# Patient Record
Sex: Male | Born: 1985 | Race: Black or African American | Hispanic: No | Smoking: Former smoker
Health system: Southern US, Community
[De-identification: ages and names within clinical notes are randomized; demographics above are authoritative.]

## PROBLEM LIST (undated history)

## (undated) DIAGNOSIS — I10 Essential (primary) hypertension: Secondary | ICD-10-CM

## (undated) DIAGNOSIS — G4730 Sleep apnea, unspecified: Secondary | ICD-10-CM

## (undated) DIAGNOSIS — E119 Type 2 diabetes mellitus without complications: Secondary | ICD-10-CM

## (undated) HISTORY — DX: Sleep apnea, unspecified: G47.30

## (undated) HISTORY — DX: Type 2 diabetes mellitus without complications: E11.9

## (undated) HISTORY — DX: Essential (primary) hypertension: I10

---
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
## 2010-08-19 ENCOUNTER — Inpatient Hospital Stay (INDEPENDENT_AMBULATORY_CARE_PROVIDER_SITE_OTHER)
Admission: RE | Admit: 2010-08-19 | Discharge: 2010-08-19 | Disposition: A | Discharge status: Home / Self Care | Payer: Managed Care, Other (non HMO) | Source: Ambulatory Visit | Attending: Emergency Medicine | Admitting: Emergency Medicine

## 2010-08-19 DIAGNOSIS — L989 Disorder of the skin and subcutaneous tissue, unspecified: Secondary | ICD-10-CM

## 2010-09-09 ENCOUNTER — Inpatient Hospital Stay (INDEPENDENT_AMBULATORY_CARE_PROVIDER_SITE_OTHER)
Admission: RE | Admit: 2010-09-09 | Discharge: 2010-09-09 | Disposition: A | Discharge status: Home / Self Care | Payer: Managed Care, Other (non HMO) | Source: Ambulatory Visit | Attending: Family Medicine | Admitting: Family Medicine

## 2010-09-09 DIAGNOSIS — L980 Pyogenic granuloma: Secondary | ICD-10-CM

## 2010-09-09 DIAGNOSIS — Z4802 Encounter for removal of sutures: Secondary | ICD-10-CM

## 2010-09-09 DIAGNOSIS — Z0489 Encounter for examination and observation for other specified reasons: Secondary | ICD-10-CM

## 2010-09-09 LAB — RPR: RPR Ser Ql: NONREACTIVE

## 2010-09-10 LAB — GC/CHLAMYDIA PROBE AMP, GENITAL
Chlamydia, DNA Probe: NEGATIVE
GC Probe Amp, Genital: NEGATIVE

## 2011-09-04 ENCOUNTER — Ambulatory Visit (INDEPENDENT_AMBULATORY_CARE_PROVIDER_SITE_OTHER): Payer: Managed Care, Other (non HMO) | Admitting: Physician Assistant

## 2011-09-04 VITALS — BP 132/90 | HR 88 | Temp 98.5°F | Resp 16 | Ht 71.0 in | Wt 265.6 lb

## 2011-09-04 DIAGNOSIS — L03319 Cellulitis of trunk, unspecified: Secondary | ICD-10-CM

## 2011-09-04 DIAGNOSIS — L02219 Cutaneous abscess of trunk, unspecified: Secondary | ICD-10-CM

## 2011-09-04 DIAGNOSIS — L03311 Cellulitis of abdominal wall: Secondary | ICD-10-CM

## 2011-09-04 LAB — POCT CBC
Hemoglobin: 15.6 g/dL (ref 14.1–18.1)
Lymph, poc: 3.2 (ref 0.6–3.4)
MCHC: 31.3 g/dL — AB (ref 31.8–35.4)
Platelet Count, POC: 322 10*3/uL (ref 142–424)
WBC: 10.4 10*3/uL — AB (ref 4.6–10.2)

## 2011-09-04 MED ORDER — DOXYCYCLINE HYCLATE 100 MG PO CAPS: 100.0000 mg | ORAL_CAPSULE | Freq: Two times a day (BID) | ORAL | Status: AC

## 2011-09-04 NOTE — Progress Notes (Signed)
Patient ID: Joseph Bullock MRN: [id number], DOB: 08/07/1985, 26 y.o. Date of Encounter: 09/04/2011, 8:41 AM  Primary Physician: No primary provider on file.  Chief Complaint: Bump on abdomen for 2 days  HPI: 26 y.o. year old male with history below presents with bump along his left lower abdomen for two days. Had a similar lesion 1-2 months prior that self resolved. Afebrile. No chills. No drainage. Mild erythema over the bump only. Tender only if he bumps into something. No nausea or vomiting.   Generally healthy.  No past medical history on file.   Home Meds: Prior to Admission medications   Not on File    Allergies: No Known Allergies  History   Social History  . Marital Status: Single    Spouse Name: N/A    Number of Children: N/A  . Years of Education: N/A   Occupational History  . Not on file.   Social History Main Topics  . Smoking status: Current Some Day Smoker  . Smokeless tobacco: Not on file  . Alcohol Use: Not on file  . Drug Use: Not on file  . Sexually Active: Not on file   Other Topics Concern  . Not on file   Social History Narrative  . No narrative on file     Review of Systems: Constitutional: negative for chills, fever, night sweats, weight changes, or fatigue  HEENT: negative for vision changes, hearing loss, congestion, rhinorrhea, ST, epistaxis, or sinus pressure Cardiovascular: negative for chest pain or palpitations Respiratory: negative for hemoptysis, wheezing, shortness of breath, or cough Abdominal: negative for abdominal pain, nausea, vomiting, diarrhea, or constipation Dermatological: see above Neurologic: negative for headache, dizziness, or syncope All other systems reviewed and are otherwise negative with the exception to those above and in the HPI.   Physical Exam: Blood pressure 132/90, pulse 88, temperature 98.5 F (36.9 C), temperature source Oral, resp. rate 16, height 5\' 11"  (1.803 m), weight 265 lb 9.6 oz (120.475  kg), SpO2 96.00%., Body mass index is 37.04 kg/(m^2). General: Well developed, well nourished, in no acute distress. Head: Normocephalic, atraumatic, eyes without discharge, sclera non-icteric, nares are without discharge.   Neck: Supple. No thyromegaly. Full ROM. No lymphadenopathy. Lungs: Clear bilaterally to auscultation without wheezes, rales, or rhonchi. Breathing is unlabored. Heart: RRR with S1 S2. No murmurs, rubs, or gallops appreciated. Abdomen: Soft, non-tender, non-distended with normoactive bowel sounds. No hepatosplenomegaly. No rebound/guarding. Pinpoint erythematous pustule along left lower abdomen without local erythema. Msk:  Strength and tone normal for age. Extremities/Skin: Warm and dry. No clubbing or cyanosis. No edema. No rashes or suspicious lesions. Neuro: Alert and oriented X 3. Moves all extremities spontaneously. Gait is normal. CNII-XII grossly in tact. Psych:  Responds to questions appropriately with a normal affect.    Procedure: VCO. Risks and benefits explained. Betadine prep per usual protocol. 20 gauge needle used to unroof lesion. Scant purulence expressed. Dressed. Patient tolerated well.  Labs: Results for orders placed in visit on 09/04/11  POCT CBC      Component Value Range   WBC 10.4 (*) 4.6 - 10.2 K/uL   Lymph, poc 3.2  0.6 - 3.4   POC LYMPH PERCENT 30.9  10 - 50 %L   MID (cbc) 0.7  0 - 0.9   POC MID % 6.7  0 - 12 %M   POC Granulocyte 6.5  2 - 6.9   Granulocyte percent 62.4  37 - 80 %G   RBC 5.50  4.69 -  6.13 M/uL   Hemoglobin 15.6  14.1 - 18.1 g/dL   HCT, POC 40.9  81.1 - 53.7 %   MCV 90.5  80 - 97 fL   MCH, POC 28.4  27 - 31.2 pg   MCHC 31.3 (*) 31.8 - 35.4 g/dL   RDW, POC 91.4     Platelet Count, POC 322  142 - 424 K/uL   MPV 8.4  0 - 99.8 fL   Wound culture pending.  ASSESSMENT AND PLAN:  26 y.o. year old male with mild cellulitis/folliciulitis -Doxycycline 100 mg 1 po bid #20 no RF -Warm compresses -Await culture  results -If worsens RTC -No I&D needed at this time  Elinor Dodge, PA-C 09/04/2011 8:41 AM

## 2011-09-07 LAB — WOUND CULTURE

## 2011-11-24 ENCOUNTER — Ambulatory Visit (INDEPENDENT_AMBULATORY_CARE_PROVIDER_SITE_OTHER): Payer: Managed Care, Other (non HMO) | Admitting: Internal Medicine

## 2011-11-24 VITALS — BP 127/81 | HR 76 | Temp 98.5°F | Resp 16 | Ht 72.0 in | Wt 263.0 lb

## 2011-11-24 DIAGNOSIS — Z6835 Body mass index (BMI) 35.0-35.9, adult: Secondary | ICD-10-CM | POA: Insufficient documentation

## 2011-11-24 DIAGNOSIS — F17200 Nicotine dependence, unspecified, uncomplicated: Secondary | ICD-10-CM | POA: Insufficient documentation

## 2011-11-24 DIAGNOSIS — A088 Other specified intestinal infections: Secondary | ICD-10-CM

## 2011-11-24 MED ORDER — DICYCLOMINE HCL 10 MG PO CAPS: 10.0000 mg | ORAL_CAPSULE | Freq: Three times a day (TID) | ORAL | Status: DC

## 2011-11-24 NOTE — Addendum Note (Signed)
Addended by: Tonye Pearson on: 11/24/2011 04:07 PM   Modules accepted: Level of Service

## 2011-11-24 NOTE — Progress Notes (Signed)
  Subjective:    Patient ID: Joseph Bullock, male    DOB: 1985/03/13, 26 y.o.   MRN: [id number]  HPI 25 yo AA M c/o painful diarrhea starting 9/28 am.  It occurs mostly after eating and preceded by sharp pains in the central epigastric region.  It happens about 3x/day and is a watery consistency.  He denies having blood in his stool but has noticed trace amounts on his tissue paper once.  No hx of an episode like this in the past and he denies having any sick contacts.  He shares that he did a lot of grilling out this weekend and cooked several different kinds of meat.  He has not had any fever, chills, or N/V.  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      Review of Systems  noncontrib     Objective:   Physical Exam General: Alert, cooperative 26 yo male in no apparent distress. Vs stable HEENT: Nontraumatic, EOMIT, trachea midline Heart: RRR, no murmurs Lungs: CTA bilaterally, no wheezing, rales, rhonchi Abdomen: Large contour, distant bs x4, nontender to palpation/no organomegaly or masses Neuro: Alert and oriented MSK: normal bulk and tone.         Assessment & Plan:  1) Gastroenteritis - dicyclomine 10 mg qid for next 24 hr//full liquids, small portions and advance as tolerated 2) Note for work today 3) RTC as needed or at then end of the week if s/s not resolved, or if develops fever  Needs routine followup to address health maintenance issues Patient Active Problem List  Diagnosis  . BMI 35.0-35.9,adult  . Nicotine addiction

## 2011-12-08 ENCOUNTER — Ambulatory Visit (INDEPENDENT_AMBULATORY_CARE_PROVIDER_SITE_OTHER): Payer: Managed Care, Other (non HMO) | Admitting: Emergency Medicine

## 2011-12-08 VITALS — BP 142/86 | HR 83 | Temp 98.3°F | Resp 20 | Ht 70.75 in | Wt 262.2 lb

## 2011-12-08 DIAGNOSIS — R319 Hematuria, unspecified: Secondary | ICD-10-CM

## 2011-12-08 DIAGNOSIS — N3090 Cystitis, unspecified without hematuria: Secondary | ICD-10-CM

## 2011-12-08 DIAGNOSIS — Z2089 Contact with and (suspected) exposure to other communicable diseases: Secondary | ICD-10-CM

## 2011-12-08 DIAGNOSIS — N3000 Acute cystitis without hematuria: Secondary | ICD-10-CM

## 2011-12-08 DIAGNOSIS — Z202 Contact with and (suspected) exposure to infections with a predominantly sexual mode of transmission: Secondary | ICD-10-CM

## 2011-12-08 LAB — POCT URINALYSIS DIPSTICK
Bilirubin, UA: NEGATIVE
Ketones, UA: NEGATIVE
Leukocytes, UA: NEGATIVE
Nitrite, UA: NEGATIVE
Protein, UA: 30

## 2011-12-08 LAB — HIV ANTIBODY (ROUTINE TESTING W REFLEX): HIV: NONREACTIVE

## 2011-12-08 LAB — POCT UA - MICROSCOPIC ONLY
Crystals, Ur, HPF, POC: NEGATIVE
Mucus, UA: POSITIVE

## 2011-12-08 MED ORDER — SULFAMETHOXAZOLE-TRIMETHOPRIM 800-160 MG PO TABS: 1.0000 | ORAL_TABLET | Freq: Two times a day (BID) | ORAL | Status: DC

## 2011-12-08 NOTE — Progress Notes (Signed)
[facility] [address], [place] [address] [phone]  Date:  12/08/2011   Name:  Joseph Bullock   DOB:  05/26/1985   MRN:  [id number]  PCP:  No primary provider on file.    Chief Complaint: Exposure to STD and Hematuria   History of Present Illness:  Joseph Bullock is a 26 y.o. very pleasant male patient who presents with the following:  Comes in requesting a check for STD's.  Denies any symptoms, no history or discharge or dysuria or rash.    Patient Active Problem List  Diagnosis  . BMI 35.0-35.9,adult  . Nicotine addiction    No past medical history on file.  No past surgical history on file.  History  Substance Use Topics  . Smoking status: Current Some Day Smoker  . Smokeless tobacco: Not on file  . Alcohol Use: Not on file    No family history on file.  No Known Allergies  Medication list has been reviewed and updated.  Current Outpatient Prescriptions on File Prior to Visit  Medication Sig Dispense Refill  . dicyclomine (BENTYL) 10 MG capsule Take 1 capsule (10 mg total) by mouth 4 (four) times daily -  before meals and at bedtime.  10 capsule  0    Review of Systems:  As per HPI, otherwise negative.    Physical Examination: Filed Vitals:   12/08/11 0756  BP: 142/86  Pulse: 83  Temp: 98.3 F (36.8 C)  Resp: 20   Filed Vitals:   12/08/11 0756  Height: 5' 10.75" (1.797 m)  Weight: 262 lb 3.2 oz (118.933 kg)   Body mass index is 36.83 kg/(m^2). Ideal Body Weight: Weight in (lb) to have BMI = 25: 177.6    GEN: WDWN, NAD, Non-toxic, Alert & Oriented x 3 HEENT: Atraumatic, Normocephalic.  Ears and Nose: No external deformity. EXTR: No clubbing/cyanosis/edema NEURO: Normal gait.  PSYCH: Normally interactive. Conversant. Not depressed or anxious appearing.  Calm demeanor.    Assessment and Plan: STD exposure Labs Hematuria sent by DOT examiner Repeat UA when completes septra Treat based upon labs Was here  last week and his diarrhea is resolved and he is "well"  Carmelina Dane, MD  Results for orders placed in visit on 12/08/11  POCT URINALYSIS DIPSTICK      Component Value Range   Color, UA amber     Clarity, UA clear     Glucose, UA neg     Bilirubin, UA neg     Ketones, UA neg     Spec Grav, UA >=1.030     Blood, UA large     pH, UA 5.5     Protein, UA 30     Urobilinogen, UA 0.2     Nitrite, UA neg     Leukocytes, UA Negative    POCT UA - MICROSCOPIC ONLY      Component Value Range   WBC, Ur, HPF, POC 0-2     RBC, urine, microscopic 12-14     Bacteria, U Microscopic trace     Mucus, UA positive     Epithelial cells, urine per micros 0-1     Crystals, Ur, HPF, POC neg     Casts, Ur, LPF, POC neg     Yeast, UA positive     I have reviewed and agree with documentation. Robert P. Merla Riches, M.D.

## 2011-12-09 LAB — GC/CHLAMYDIA PROBE AMP, URINE: Chlamydia, Swab/Urine, PCR: NEGATIVE

## 2011-12-09 LAB — HSV(HERPES SIMPLEX VRS) I + II AB-IGG: HSV 1 Glycoprotein G Ab, IgG: <0.1 IV

## 2011-12-11 ENCOUNTER — Ambulatory Visit (INDEPENDENT_AMBULATORY_CARE_PROVIDER_SITE_OTHER): Payer: Managed Care, Other (non HMO) | Admitting: Emergency Medicine

## 2011-12-11 VITALS — BP 146/80 | HR 87 | Temp 98.1°F | Resp 18 | Ht 71.0 in | Wt 266.4 lb

## 2011-12-11 DIAGNOSIS — R319 Hematuria, unspecified: Secondary | ICD-10-CM

## 2011-12-11 LAB — POCT UA - MICROSCOPIC ONLY
Casts, Ur, LPF, POC: NEGATIVE
Crystals, Ur, HPF, POC: NEGATIVE
Mucus, UA: NEGATIVE
Yeast, UA: NEGATIVE

## 2011-12-11 LAB — POCT URINALYSIS DIPSTICK
Nitrite, UA: NEGATIVE
Protein, UA: NEGATIVE
Spec Grav, UA: 1.015
Urobilinogen, UA: 0.2
pH, UA: 6.5

## 2011-12-11 NOTE — Progress Notes (Signed)
[facility] [address] [place] [address] [phone]  Date:  12/11/2011   Name:  Joseph Bullock   DOB:  13-Mar-1985   MRN:  [id number]  PCP:  No primary provider on file.    Chief Complaint: Hematuria   History of Present Illness:  Joseph Bullock is a 26 y.o. 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  Patient describes life long history of hematuria that was evaluated while a child.  Denies complaints.  Patient Active Problem List  Diagnosis  . BMI 35.0-35.9,adult  . Nicotine addiction    No past medical history on file.  No past surgical history on file.  History  Substance Use Topics  . Smoking status: Current Some Day Smoker  . Smokeless tobacco: Not on file  . Alcohol Use: Not on file    No family history on file.  No Known Allergies  Medication list has been reviewed and updated.  Current Outpatient Prescriptions on File Prior to Visit  Medication Sig Dispense Refill  . sulfamethoxazole-trimethoprim (BACTRIM DS,SEPTRA DS) 800-160 MG per tablet Take 1 tablet by mouth 2 (two) times daily.  20 tablet  0  . dicyclomine (BENTYL) 10 MG capsule Take 1 capsule (10 mg total) by mouth 4 (four) times daily -  before meals and at bedtime.  10 capsule  0    Review of Systems:  As per HPI, otherwise negative.    Physical Examination: Filed Vitals:   12/11/11 0804  BP: 146/80  Pulse: 87  Temp: 98.1 F (36.7 C)  Resp: 18   Filed Vitals:   12/11/11 0804  Height: 5\' 11"  (1.803 m)  Weight: 266 lb 6.4 oz (120.838 kg)   Body mass index is 37.16 kg/(m^2). Ideal Body Weight: Weight in (lb) to have BMI = 25: 178.9    GEN: WDWN, NAD, Non-toxic, Alert & Oriented x 3 HEENT: Atraumatic, Normocephalic.  Ears and Nose: No external deformity. EXTR: No clubbing/cyanosis/edema NEURO: Normal gait.  PSYCH: Normally interactive. Conversant. Not  depressed or anxious appearing.  Calm demeanor.  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   Carmelina Dane, MD  Results for orders placed in visit on 12/11/11  POCT UA - MICROSCOPIC ONLY      Component Value Range   WBC, Ur, HPF, POC 0-1     RBC, urine, microscopic 10-16     Bacteria, U Microscopic trace     Mucus, UA neg     Epithelial cells, urine per micros 0-1     Crystals, Ur, HPF, POC neg     Casts, Ur, LPF, POC neg     Yeast, UA neg    POCT URINALYSIS DIPSTICK      Component Value Range   Color, UA yellow     Clarity, UA clear     Glucose, UA neg     Bilirubin, UA neg     Ketones, UA neg     Spec Grav, UA 1.015     Blood, UA large     pH, UA 6.5     Protein, UA neg     Urobilinogen, UA 0.2     Nitrite, UA neg     Leukocytes, UA Negative     Ref uro  I have reviewed and agree with  documentation. Robert P. Merla Riches, M.D.

## 2012-01-07 ENCOUNTER — Ambulatory Visit (INDEPENDENT_AMBULATORY_CARE_PROVIDER_SITE_OTHER): Payer: Managed Care, Other (non HMO) | Admitting: Emergency Medicine

## 2012-01-07 VITALS — BP 141/88 | HR 88 | Temp 98.3°F | Resp 18 | Ht 71.0 in | Wt 264.0 lb

## 2012-01-07 DIAGNOSIS — G4733 Obstructive sleep apnea (adult) (pediatric): Secondary | ICD-10-CM | POA: Insufficient documentation

## 2012-01-07 DIAGNOSIS — E669 Obesity, unspecified: Secondary | ICD-10-CM

## 2012-01-07 NOTE — Progress Notes (Signed)
  Subjective:    Patient ID: Joseph Bullock, male    DOB: November 29, 1985, 26 y.o.   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 Time he was diagnosed with sleep apnea and was prescribed a machine that he did not get it. Because his work is requiring that he have a CPAP machine be titrated and be compliant with use of the machine to    Review of Systems     Objective:   Physical Exam chest is clear . Heart regular rate no murmurs rubs or gallops. Abdomen soft nontender. Extremities without edema the        Assessment & Plan:  Patient needs referral for sleep study with Dr. Cherlynn June and her sleep lab. Patient was advised at under CDL guidelines he must be compliant with his CPAP machine. Patient braced understanding of this

## 2012-02-09 ENCOUNTER — Encounter: Payer: Self-pay | Admitting: Neurology

## 2012-03-09 ENCOUNTER — Ambulatory Visit (INDEPENDENT_AMBULATORY_CARE_PROVIDER_SITE_OTHER): Payer: Managed Care, Other (non HMO) | Admitting: Family Medicine

## 2012-03-09 VITALS — BP 140/88 | HR 95 | Temp 98.1°F | Resp 16 | Ht 71.0 in | Wt 259.0 lb

## 2012-03-09 DIAGNOSIS — Z23 Encounter for immunization: Secondary | ICD-10-CM

## 2012-03-09 DIAGNOSIS — R3129 Other microscopic hematuria: Secondary | ICD-10-CM

## 2012-03-09 DIAGNOSIS — Z0000 Encounter for general adult medical examination without abnormal findings: Secondary | ICD-10-CM

## 2012-03-09 DIAGNOSIS — I4949 Other premature depolarization: Secondary | ICD-10-CM

## 2012-03-09 DIAGNOSIS — I493 Ventricular premature depolarization: Secondary | ICD-10-CM

## 2012-03-09 NOTE — Patient Instructions (Addendum)
Good to see you today!  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  If you start to notice any symptoms such as heart palpitations or chest pain please seek care.  However, the early beats you showed on your EKG today are very common and rarely of any concern.    You do show some blood in your urine today- this could be significant if it persists.  Please have a repeat urinalysis in the next 3 or 4 weeks.    I will order this- you can come in for just a lab visit at your convenience.

## 2012-03-09 NOTE — Progress Notes (Signed)
[facility] [address], [place] [address] [phone]  Date:  03/09/2012   Name:  Joseph Bullock   DOB:  March 05, 1985   MRN:  [id number]  PCP:  Joseph Hoard, MD    Chief Complaint: Annual Exam   History of Present Illness:  Joseph Bullock is a 27 y.o. very pleasant male patient who presents with the following:  Here for a DOT exam and annual CPE today. He was seen in November to follow- up his CPAP use- he was referred back to Dr. Vickey Huger for evaluation.  He did follow- up as directed. Had a repeat sleep study and CPAP titration.    He is using his CPAP machine- he is doing well with this and finds it is comfortable to use, feels much better with CPAP use.  He wears the machine about 5 hours a night.  He has lost 5 lbs since November.    Joseph Bullock declined any lab work today, but would like to get a flu shot and a tetanus shot- he has not had a tetanus shot since he was a child.   He is otherwise healthy, but he does note a history of microhematuria.  Per his report this has been present for many years and he has had a thorough work- up, found to be benign.    Patient Active Problem List  Diagnosis  . BMI 35.0-35.9,adult  . Nicotine addiction  . OSA (obstructive sleep apnea)    No past medical history on file.  No past surgical history on file.  History  Substance Use Topics  . Smoking status: Current Some Day Smoker  . Smokeless tobacco: Not on file  . Alcohol Use: Not on file    No family history on file.  No Known Allergies  Medication list has been reviewed and updated.  Current Outpatient Prescriptions on File Prior to Visit  Medication Sig Dispense Refill  . dicyclomine (BENTYL) 10 MG capsule Take 1 capsule (10 mg total) by mouth 4 (four) times daily -  before meals and at bedtime.  10 capsule  0  . sulfamethoxazole-trimethoprim (BACTRIM DS,SEPTRA DS) 800-160 MG per tablet Take 1 tablet by mouth 2 (two) times daily.  20 tablet  0     Review of Systems:  As per HPI- otherwise negative.   Physical Examination: Filed Vitals:   03/09/12 0812  BP: 140/88  Pulse: 95  Temp: 98.1 F (36.7 C)  Resp: 16   Filed Vitals:   03/09/12 0812  Height: 5\' 11"  (1.803 m)  Weight: 259 lb (117.482 kg)   Body mass index is 36.12 kg/(m^2). Ideal Body Weight: Weight in (lb) to have BMI = 25: 178.9   GEN: WDWN, NAD, Non-toxic, A & O x 3, obese HEENT: Atraumatic, Normocephalic. Neck supple. No masses, No LAD. Bilateral TM wnl, oropharynx normal.  PEERL,EOMI.   Ears and Nose: No external deformity. CV: RRR with occasional skipped beat- likely PVC, No M/G/R. No JVD. No thrill. No extra heart sounds. PULM: CTA B, no wheezes, crackles, rhonchi. No retractions. No resp. distress. No accessory muscle use. ABD: S, NT, ND, +BS. No rebound. No HSM. EXTR: No c/c/e NEURO Normal gait.  PSYCH: Normally interactive. Conversant. Not depressed or anxious appearing.  Calm demeanor.  No inguinal hernia.   Results for orders placed in visit on 12/11/11  POCT UA - MICROSCOPIC ONLY      Component Value Range   WBC, Ur, HPF, POC 0-1  RBC, urine, microscopic 10-16     Bacteria, U Microscopic trace     Mucus, UA neg     Epithelial cells, urine per micros 0-1     Crystals, Ur, HPF, POC neg     Casts, Ur, LPF, POC neg     Yeast, UA neg    POCT URINALYSIS DIPSTICK      Component Value Range   Color, UA yellow     Clarity, UA clear     Glucose, UA neg     Bilirubin, UA neg     Ketones, UA neg     Spec Grav, UA 1.015     Blood, UA large     pH, UA 6.5     Protein, UA neg     Urobilinogen, UA 0.2     Nitrite, UA neg     Leukocytes, UA Negative      EKG: occasional PVC, no concerning ST elevation or depression  Assessment and Plan: 1. Physical exam, annual    2. PVC (premature ventricular contraction)  EKG 12-Lead  3. Immunization due  Flu vaccine greater than or equal to 3yo preservative free IM, Tdap vaccine greater than or  equal to 7yo IM  4. Microhematuria  POCT UA - Microscopic Only, POCT urinalysis dipstick   Discussed microhematuria with pt- this has been evaluated.  He will disregard pt instructions to recheck a UA.  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 Occasional PVCs on EKG and ascultation.  He is asymptomatic and does not have any multiple PVCs in sequence.  This finding is almost certainly benign, but did ask him to report if any symptoms such as CP or palpitations.    Abbe Amsterdam, MD

## 2012-05-19 ENCOUNTER — Encounter: Payer: Self-pay | Admitting: Neurology

## 2012-05-27 ENCOUNTER — Encounter: Payer: Self-pay | Admitting: Neurology

## 2012-06-03 ENCOUNTER — Telehealth: Payer: Self-pay | Admitting: Neurology

## 2012-06-03 NOTE — Telephone Encounter (Signed)
Called the patient and advised him that his cpap compliance was low and requested a call back to the office for discussion.

## 2012-06-03 NOTE — Telephone Encounter (Signed)
Message copied by [facility] [employer] on Fri Jun 03, 2012 10:51 AM ------      Message from: [facility], CARMEN      Created: Fri May 27, 2012  6:05 PM        [facility] patient with low compliance , all data were here. CD ------

## 2012-07-02 ENCOUNTER — Encounter: Payer: Self-pay | Admitting: Neurology

## 2012-07-04 NOTE — Progress Notes (Signed)
Quick Note:  Dear Mr . Wilhoite ,  Your current CPAP machine only records the hours of use , but fails to provide diagnostic data .  I recommend to have your DME provide a loan machine for several days , that can provide these data to see how well you are doing in terms of apnea resolution.  Please give [place] a call at [phone].   Sincerely ,   Ambera Fedele, MD   ______

## 2012-07-15 ENCOUNTER — Ambulatory Visit (INDEPENDENT_AMBULATORY_CARE_PROVIDER_SITE_OTHER): Payer: Managed Care, Other (non HMO) | Admitting: Emergency Medicine

## 2012-07-15 VITALS — BP 126/86 | HR 67 | Temp 98.5°F | Resp 16 | Ht 71.0 in | Wt 251.2 lb

## 2012-07-15 DIAGNOSIS — Z2089 Contact with and (suspected) exposure to other communicable diseases: Secondary | ICD-10-CM

## 2012-07-15 DIAGNOSIS — Z202 Contact with and (suspected) exposure to infections with a predominantly sexual mode of transmission: Secondary | ICD-10-CM

## 2012-07-15 DIAGNOSIS — R5381 Other malaise: Secondary | ICD-10-CM

## 2012-07-15 LAB — HEPATITIS C ANTIBODY: HCV Ab: NEGATIVE

## 2012-07-15 LAB — RPR

## 2012-07-15 NOTE — Progress Notes (Signed)
  Subjective:    Patient ID: Joseph Bullock, male    DOB: 11-29-1985, 27 y.o.   MRN: [id number]  HPI patient enters with the chief complaint of fatigue and a history of 3 sexual encounters this year . He has a burning at the tip of his penis but no true discharge. He is concerned he may have a low testosterone would like this checked    Review of Systems     Objective:   Physical Exam HEENT exam is unremarkable neck is supple. Chest is clear to auscultation and percussion. Heart regular no murmurs. Abdomen soft nontender. Genital exam reveals no discharge testes are normal        Assessment & Plan:  Testosterone Gen-Probe RPR HIV and hep C done to

## 2012-07-16 LAB — GC/CHLAMYDIA PROBE AMP: GC Probe RNA: NEGATIVE

## 2012-07-19 LAB — TESTOSTERONE, FREE, TOTAL, SHBG
Sex Hormone Binding: 15 nmol/L (ref 13–71)
Testosterone, Free: 83.8 pg/mL (ref 47.0–244.0)

## 2012-08-16 ENCOUNTER — Encounter: Payer: Self-pay | Admitting: Family Medicine

## 2013-02-20 ENCOUNTER — Encounter: Payer: Self-pay | Admitting: Neurology

## 2013-03-22 ENCOUNTER — Encounter: Payer: Self-pay | Admitting: Neurology

## 2013-03-23 ENCOUNTER — Encounter: Payer: Self-pay | Admitting: Neurology

## 2013-04-24 ENCOUNTER — Ambulatory Visit (INDEPENDENT_AMBULATORY_CARE_PROVIDER_SITE_OTHER): Payer: Managed Care, Other (non HMO) | Admitting: Physician Assistant

## 2013-04-24 VITALS — BP 120/72 | HR 79 | Temp 98.0°F | Resp 18 | Ht 71.0 in | Wt 250.0 lb

## 2013-04-24 DIAGNOSIS — Z113 Encounter for screening for infections with a predominantly sexual mode of transmission: Secondary | ICD-10-CM

## 2013-04-24 NOTE — Progress Notes (Signed)
   Subjective:    Patient ID: Joseph Bullock, male    DOB: 1985/06/04, 28 y.o.   MRN: [id number]  HPI   Mr. Joseph Bullock a very pleasant 28 yr old male here requesting STI testing.  He is in a new relationship and would like testing.  He reports he is currently using condoms.  He has 1 sexual partner.  He denies any symptoms.  Partner is also asymptomatic.  He was last tested in May 2014 - all negative at that time.  He has never tested positive.  He otherwise feels well today.    Review of Systems  Constitutional: Negative for fever and chills.  Respiratory: Negative.   Cardiovascular: Negative.   Gastrointestinal: Negative.   Genitourinary: Negative for dysuria, discharge and genital sores.  Musculoskeletal: Negative.   Skin: Negative.        Objective:   Physical Exam  Vitals reviewed. Constitutional: He is oriented to person, place, and time. He appears well-developed and well-nourished. No distress.  HENT:  Head: Normocephalic and atraumatic.  Eyes: Conjunctivae are normal. No scleral icterus.  Cardiovascular: Normal rate, regular rhythm and normal heart sounds.   Pulmonary/Chest: Effort normal and breath sounds normal. He has no wheezes. He has no rales.  Abdominal: Soft. Bowel sounds are normal. There is no tenderness.  Neurological: He is alert and oriented to person, place, and time.  Skin: Skin is warm and dry.  Psychiatric: He has a normal mood and affect. His behavior is normal.       Assessment & Plan:  Screen for STD (sexually transmitted disease) - Plan: GC/Chlamydia Probe Amp, HIV antibody, RPR, HSV(herpes simplex vrs) 1+2 ab-IgG   Mr. Joseph Bullock is a pleasant 28 yr old male here for STI testing.  He is asymptomatic today but would like testing as he is in a new relationship.  Uriprobe, HIV, RPR, and HSV testing all pending.  We discussed that HSV ab testing would not give us information on when/where he was exposed, or if/when he will have an outbreak.  He understands and  would like to proceed with testing.  Will follow up on results and treat if necessary  Pt to call or RTC if worsening or not improving  E. Frances FurbishElizabeth Mirabel Bullock MHS, PA-C [facility] 3/2/20153:05 PM

## 2013-04-24 NOTE — Patient Instructions (Signed)
Safe Sex Safe sex is about reducing the risk of giving or getting a sexually transmitted disease (STD). STDs are spread through sexual contact involving the genitals, mouth, or rectum. Some STDS can be cured and others cannot. Safe sex can also prevent unintended pregnancies.  SAFE SEX PRACTICES  Limit your sexual activity to only one partner who is only having sex with you.  Talk to your partner about their past partners, past STDs, and drug use.  Use a condom every time you have sexual intercourse. This includes vaginal, oral, and anal sexual activity. Both females and males should wear condoms during oral sex. Only use latex or polyurethane condoms and water-based lubricants. Petroleum-based lubricants or oils used to lubricate a condom will weaken the condom and increase the chance that it will break. The condom should be in place from the beginning to the end of sexual activity. Wearing a condom reduces, but does not completely eliminate, your risk of getting or giving a STD. STDs can be spread by contact with skin of surrounding areas.  Get vaccinated for hepatitis B and HPV.  Avoid alcohol and recreational drugs which can affect your judgement. You may forget to use a condom or participate in high-risk sex.  For females, avoid douching after sexual intercourse. Douching can spread an infection farther into the reproductive tract.  Check your body for signs of sores, blisters, rashes, or unusual discharge. See your caregiver if you notice any of these signs.  Avoid sexual contact if you have symptoms of an infection or are being treated for an STD. If you or your partner has herpes, avoid sexual contact when blisters are present. Use condoms at all other times.  See your caregiver for regular screenings, examinations, and tests for STDs. Before having sex with a new partner, each of you should be screened for STDs and talk about the results with your partner. BENEFITS OF SAFE SEX   There  is less of a chance of getting or giving an STD.  You can prevent unwanted or unintended pregnancies.  By discussing safer sex concerns with your partner, you may increase feelings of intimacy, comfort, trust, and honesty between the both of you. Document Released: 03/19/2004 Document Revised: 11/04/2011 Document Reviewed: 08/03/2011 [employer] Patient Information 2014 [employer].  

## 2013-04-25 LAB — HSV(HERPES SIMPLEX VRS) I + II AB-IGG: HSV 2 Glycoprotein G Ab, IgG: 0.11 IV

## 2013-04-25 LAB — GC/CHLAMYDIA PROBE AMP
CT Probe RNA: NEGATIVE
GC Probe RNA: NEGATIVE

## 2013-04-25 LAB — HIV ANTIBODY (ROUTINE TESTING W REFLEX): HIV: NONREACTIVE

## 2013-04-25 LAB — RPR

## 2013-05-16 ENCOUNTER — Encounter: Payer: Self-pay | Admitting: Neurology

## 2013-05-18 ENCOUNTER — Encounter: Payer: Self-pay | Admitting: Neurology

## 2013-06-06 ENCOUNTER — Encounter: Payer: Self-pay | Admitting: *Deleted

## 2013-06-07 ENCOUNTER — Ambulatory Visit: Payer: Managed Care, Other (non HMO) | Admitting: Neurology

## 2014-01-14 ENCOUNTER — Ambulatory Visit (INDEPENDENT_AMBULATORY_CARE_PROVIDER_SITE_OTHER): Payer: Managed Care, Other (non HMO) | Admitting: Family Medicine

## 2014-01-14 VITALS — BP 120/80 | HR 68 | Temp 97.9°F | Resp 20 | Ht 71.25 in | Wt 248.2 lb

## 2014-01-14 DIAGNOSIS — N342 Other urethritis: Secondary | ICD-10-CM

## 2014-01-14 LAB — RPR

## 2014-01-14 LAB — HIV ANTIBODY (ROUTINE TESTING W REFLEX): HIV 1&2 Ab, 4th Generation: NONREACTIVE

## 2014-01-14 MED ORDER — AZITHROMYCIN 250 MG PO TABS: ORAL_TABLET | ORAL | Status: DC

## 2014-01-14 MED ORDER — CEFTRIAXONE SODIUM 250 MG IJ SOLR: 250.0000 mg | Freq: Once | INTRAMUSCULAR | Status: DC

## 2014-01-14 MED ORDER — CEFTRIAXONE SODIUM 1 G IJ SOLR
250.0000 mg | Freq: Once | INTRAMUSCULAR | Status: AC
  Administered 2014-01-14: 250 mg via INTRAMUSCULAR

## 2014-01-14 NOTE — Progress Notes (Signed)
° °  Subjective:    Patient ID: Joseph PalauJames T Son, male    DOB: Mar 10, 1985, 28 y.o.   MRN: [id number] This chart was scribed for Elvina SidleKurt Lauenstein, MD by Jolene Provostobert Halas, Medical Scribe. This patient was seen in Room 13 and the patient's care was started a 12:54 PM.  Chief Complaint  Patient presents with   Cough    cough for several weeks.  would also like std check    HPI HPI Comments: Joseph Bullock is a 28 y.o. male who presents to [facility] complaining of penile discharge that started two days ago. Pt states that he slept someone one week ago and two days ago developed dysuria and penile discharge.   Pt is a Naval architecttruck driver.   Review of Systems  Constitutional: Negative for fever and chills.  Genitourinary: Positive for dysuria, discharge and penile pain.  Skin: Negative for rash.       Objective:   Physical Exam  Constitutional: He is oriented to person, place, and time. He appears well-developed and well-nourished.  HENT:  Head: Normocephalic and atraumatic.  Eyes: Pupils are equal, round, and reactive to light.  Neck: Neck supple.  Cardiovascular: Normal rate and regular rhythm.   Pulmonary/Chest: Effort normal and breath sounds normal. No respiratory distress.  Genitourinary:  Penile discharge from meatus.   Neurological: He is alert and oriented to person, place, and time.  Skin: Skin is warm and dry.  Psychiatric: He has a normal mood and affect. His behavior is normal.  Nursing note and vitals reviewed.      Assessment & Plan:   This chart was scribed in my presence and reviewed by me personally. Urethritis - Plan: GC/Chlamydia Amp Probe, Genital, HIV antibody (with reflex), RPR, azithromycin (ZITHROMAX) 250 MG tablet, cefTRIAXone (ROCEPHIN) 250 MG injection, cefTRIAXone (ROCEPHIN) injection 250 mg  Signed, Elvina SidleKurt Lauenstein, MD

## 2014-01-20 LAB — GC/CHLAMYDIA PROBE AMP
CT Probe RNA: NEGATIVE
GC Probe RNA: NEGATIVE

## 2014-02-19 ENCOUNTER — Ambulatory Visit (INDEPENDENT_AMBULATORY_CARE_PROVIDER_SITE_OTHER): Payer: Managed Care, Other (non HMO) | Admitting: Neurology

## 2014-02-19 ENCOUNTER — Encounter: Payer: Self-pay | Admitting: Neurology

## 2014-02-19 VITALS — BP 131/80 | HR 79 | Resp 16 | Ht 72.0 in | Wt 257.0 lb

## 2014-02-19 DIAGNOSIS — G4733 Obstructive sleep apnea (adult) (pediatric): Secondary | ICD-10-CM | POA: Insufficient documentation

## 2014-02-19 DIAGNOSIS — Z9989 Dependence on other enabling machines and devices: Principal | ICD-10-CM

## 2014-02-19 NOTE — Progress Notes (Signed)
SLEEP MEDICINE CLINIC   Provider:  Melvyn Novas, M D  Referring Provider: Peyton Najjar, MD Primary Care Physician:  Janace Hoard, MD  Chief Complaint  Patient presents with  . RV sleep cpap    Rm 10, alone    HPI:  Joseph Bullock is a 28 y.o. male seen here as a revisit  from Dr. Alwyn Ren for CPAP compliance after a 2 year hiatus. He is a [employer] driver. .   Mr. Joseph Bullock is a right-handed African-American gentleman who was first tested in 2011 for sleep apnea at the time he was diagnosed with obstructive sleep apnea and titrated to 9 cm water. Soon after he lost his insurance and the ability to obtain equipment. 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 He had complained of headaches, snoring and excessive daytime sleepiness at the time he was an active smoker. He has rather mild apnea by home sleep test but a strong variability in his heart rate between 52 and 128 bpm this suggested that there was a higher apnea index. The study was repeated and the second home sleep test revealed an AHI of 52. About a third of his apneas were central apneas. He was then titrated in a in lab sleep study. The machine chosen for him was a ResMed machine and he was titrated to 9 cm water. He did very well and only 6 cm water pressure but thunderous snoring had been noted and was not completely alleviated. He had no significant periodic limb movements of sleep. He was regular referred to have an O2 sat machine which allowed him to sleep by the pressures would be automatically adjusted between 5 and centimeter 10 cm water.  The patient is here today for a 2 year revisit he states he has been doing very well with his machine and he actually sleeps and feels better. His Epworth sleepiness score is endorsed at 77 cm and his fatigue severity at 31 points the son normal ranges for these questionnaires.  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  He usually obtains about 6 hours of sleep at night.  He has 100% compliance for the last 30 days as his don't load revealed he is followed by our [facility]. Sleep management solutions the average usage is 5 hours and 35 minutes and the patient has an average AHI of 2.6 at only 6 cm water pressure. This is a very tolerable pressure he does like the  nasal mask. Eson used in large size. His 66.7 % compliance for 4 hours, just below 70%. He had no recent URIs and no sinusitis/ rhinitis.        Review of Systems: Out of a complete 14 system review, the patient complains of only the following symptoms, and all other reviewed systems are negative.  he snores rarley throught he CPAP, he is not longer fatigued or s daytime sleepy. Highly compliant.      History   Social History  . Marital Status: Single    Spouse Name: N/A    Number of Children: N/A  . Years of Education: N/A   Occupational History  . Not on file.   Social History Main Topics  . Smoking status: Former Games developer  . Smokeless tobacco: Not on file  . Alcohol Use: 1.8 oz/week    3 Cans of beer per week  . Drug Use: No  .  Sexual Activity: Yes   Other Topics Concern  . Not on file   Social History Narrative   Patient is single and lives at home with his father.   Patient works at [employer].   Patient has a high school education.   Patient is right-handed.   Patient drinks coffee once a week.          History reviewed. No pertinent family history.  Past Medical History  Diagnosis Date  . Sleep apnea     Past Surgical History  Procedure Laterality Date  . Tonsillectomy and adenoidectomy  1994    No current outpatient prescriptions on file.   No current facility-administered medications for this visit.    Allergies as of 02/19/2014  . (No Known Allergies)    Vitals: BP 131/80 mmHg  Pulse 79  Resp 16  Ht 6' (1.829 m)  Wt 257 lb (116.574 kg)  BMI 34.85 kg/m2 Last Weight:  Wt  Readings from Last 1 Encounters:  02/19/14 257 lb (116.574 kg)       Last Height:   Ht Readings from Last 1 Encounters:  02/19/14 6' (1.829 m)    Physical exam:  General: The patient is awake, alert and appears not in acute distress. The patient is well groomed. Head: Normocephalic, atraumatic. Neck is supple. Mallampati 4 , large tongue. tonsillectomy at age 89.  neck circumference:18.5 . Nasal airflow unrestricted , TMJ is  not  evident . Retrognathia is not seen.  Cardiovascular:  Regular rate and rhythm , without  murmurs or carotid bruit, and without distended neck veins. Respiratory: Lungs are clear to auscultation. Skin:  Without evidence of edema, or rash Trunk: this  patient  has normal posture.  Neurologic exam : The patient is awake and alert, oriented to place and time.   Memory subjective described as intact.  There is a normal attention span & concentration ability.  Speech is fluent without  dysarthria, dysphonia or aphasia.  Mood and affect are appropriate.  Cranial nerves: Pupils are equal and briskly reactive to light. Funduscopic exam without evidence of pallor or edema.  Extraocular movements  in vertical and horizontal planes intact and without nystagmus. Visual fields by finger perimetry are intact. Hearing to finger rub intact. Facial sensation intact to fine touch. Facial motor strength is symmetric and tongue moves midline.  Motor exam:  Normal tone ,muscle bulk and symmetric strength in all extremities.  Sensory:  Fine touch, pinprick and vibration were tested in all extremities.  Proprioception is  normal.   Gait and station: Patient walks without assistive device . Strength within normal limits. Stance is stable and normal.   Deep tendon reflexes: in the  upper and lower extremities are symmetric and intact. Babinski maneuver response is downgoing.   Assessment:  After physical and neurologic examination, review of laboratory studies, imaging,  neurophysiology testing and pre-existing records, assessment is  compliance by days of use is 100% , Typical for a night shift worker, he has some blocks of sleep and not always continuous use of the CPAP.  66.7 % for over 4 hours of use.  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   The patient was advised of the nature of the diagnosed sleep disorder , the treatment options and risks for general a health and wellness arising from not treating the condition. Visit duration was 30 minutes.   Plan:  Treatment plan and additional workup :\ continue use of your CPAP for 4 hours or more  each night. Yearly visit for DOT driver.  [facility] will send me an auto-download in 2 days which should meet the 70% compliance or 4 hours      Melvyn Novasarmen Feleshia Zundel MD  02/19/2014

## 2014-02-19 NOTE — Patient Instructions (Signed)
You stopped smoking- That is great- your health will improve.

## 2014-02-21 ENCOUNTER — Encounter: Payer: Self-pay | Admitting: Neurology

## 2014-02-23 DIAGNOSIS — G4730 Sleep apnea, unspecified: Secondary | ICD-10-CM

## 2014-02-23 HISTORY — DX: Sleep apnea, unspecified: G47.30

## 2014-12-11 ENCOUNTER — Encounter: Payer: Self-pay | Admitting: Neurology

## 2015-02-15 ENCOUNTER — Ambulatory Visit: Payer: Managed Care, Other (non HMO) | Admitting: Neurology

## 2015-07-11 ENCOUNTER — Ambulatory Visit: Payer: Self-pay | Admitting: Neurology

## 2015-07-11 ENCOUNTER — Telehealth: Payer: Self-pay

## 2015-07-11 NOTE — Telephone Encounter (Signed)
Pt did not show for their appt with Dr. Dohmeier today.  

## 2015-07-12 ENCOUNTER — Encounter: Payer: Self-pay | Admitting: Neurology

## 2016-02-24 HISTORY — PX: NASAL SEPTUM SURGERY: SHX37

## 2016-03-06 ENCOUNTER — Ambulatory Visit (INDEPENDENT_AMBULATORY_CARE_PROVIDER_SITE_OTHER): Payer: Managed Care, Other (non HMO) | Admitting: Family Medicine

## 2016-03-06 VITALS — BP 138/90 | HR 82 | Temp 98.7°F | Ht 71.25 in | Wt 254.0 lb

## 2016-03-06 DIAGNOSIS — E6609 Other obesity due to excess calories: Secondary | ICD-10-CM

## 2016-03-06 DIAGNOSIS — R7303 Prediabetes: Secondary | ICD-10-CM | POA: Diagnosis not present

## 2016-03-06 DIAGNOSIS — R300 Dysuria: Secondary | ICD-10-CM | POA: Diagnosis not present

## 2016-03-06 DIAGNOSIS — IMO0001 Reserved for inherently not codable concepts without codable children: Secondary | ICD-10-CM

## 2016-03-06 DIAGNOSIS — H04203 Unspecified epiphora, bilateral lacrimal glands: Secondary | ICD-10-CM | POA: Diagnosis not present

## 2016-03-06 DIAGNOSIS — R3129 Other microscopic hematuria: Secondary | ICD-10-CM

## 2016-03-06 DIAGNOSIS — Z6835 Body mass index (BMI) 35.0-35.9, adult: Secondary | ICD-10-CM | POA: Diagnosis not present

## 2016-03-06 DIAGNOSIS — R030 Elevated blood-pressure reading, without diagnosis of hypertension: Secondary | ICD-10-CM | POA: Diagnosis not present

## 2016-03-06 DIAGNOSIS — L731 Pseudofolliculitis barbae: Secondary | ICD-10-CM | POA: Diagnosis not present

## 2016-03-06 DIAGNOSIS — Z9189 Other specified personal risk factors, not elsewhere classified: Secondary | ICD-10-CM

## 2016-03-06 DIAGNOSIS — Z113 Encounter for screening for infections with a predominantly sexual mode of transmission: Secondary | ICD-10-CM

## 2016-03-06 DIAGNOSIS — R3589 Other polyuria: Secondary | ICD-10-CM

## 2016-03-06 DIAGNOSIS — R358 Other polyuria: Secondary | ICD-10-CM

## 2016-03-06 LAB — POCT URINALYSIS DIP (MANUAL ENTRY)
Bilirubin, UA: NEGATIVE
GLUCOSE UA: NEGATIVE
Ketones, POC UA: NEGATIVE
Leukocytes, UA: NEGATIVE
NITRITE UA: NEGATIVE
Protein Ur, POC: NEGATIVE
Spec Grav, UA: 1.025
Urobilinogen, UA: 0.2
pH, UA: 6

## 2016-03-06 LAB — POC MICROSCOPIC URINALYSIS (UMFC): MUCUS RE: ABSENT

## 2016-03-06 LAB — POCT GLYCOSYLATED HEMOGLOBIN (HGB A1C): Hemoglobin A1C: 5.8

## 2016-03-06 NOTE — Patient Instructions (Addendum)
IF you received an x-ray today, you will receive an invoice from [facility]. Please contact [facility] at [phone] with questions or concerns regarding your invoice.   IF you received labwork today, you will receive an invoice from [facility]. Please contact [facility] at [phone] with questions or concerns regarding your invoice.   Our billing staff will not be able to assist you with questions regarding bills from these companies.  You will be contacted with the lab results as soon as they are available. The fastest way to get your results is to activate your My Chart account. Instructions are located on the last page of this paperwork. If you have not heard from us regarding the results in 2 weeks, please contact this office.      Sexually Transmitted Disease A sexually transmitted disease (STD) is a disease or infection often passed to another person during sex. However, STDs can be passed through nonsexual ways. An STD can be passed through:  Spit (saliva).  Semen.  Blood.  Mucus from the vagina.  Pee (urine). How can I lessen my chances of getting an STD?  Use:  Latex condoms.  Water-soluble lubricants with condoms. Do not use petroleum jelly or oils.  Dental dams. These are small pieces of latex that are used as a barrier during oral sex.  Avoid having more than one sex partner.  Do not have sex with someone who has other sex partners.  Do not have sex with anyone you do not know or who is at high risk for an STD.  Avoid risky sex that can break your skin.  Do not have sex if you have open sores on your mouth or skin.  Avoid drinking too much alcohol or taking illegal drugs. Alcohol and drugs can affect your good judgment.  Avoid oral and anal sex acts.  Get shots (vaccines) for HPV and hepatitis.  If you are at risk of being infected with HIV, it is advised that you take a certain medicine daily to prevent HIV infection. This is  called pre-exposure prophylaxis (PrEP). You may be at risk if:  You are a man who has sex with other men (MSM).  You are attracted to the opposite sex (heterosexual) and are having sex with more than one partner.  You take drugs with a needle.  You have sex with someone who has HIV.  Talk with your doctor about if you are at high risk of being infected with HIV. If you begin to take PrEP, get tested for HIV first. Get tested every 3 months for as long as you are taking PrEP.  Get tested for STDs every year if you are sexually active. If you are treated for an STD, get tested again 3 months after you are treated. What should I do if I think I have an STD?  See your doctor.  Tell your sex partner(s) that you have an STD. They should be tested and treated.  Do not have sex until your doctor says it is okay. When should I get help? Get help right away if:  You have bad belly (abdominal) pain.  You are a man and have puffiness (swelling) or pain in your testicles.  You are a woman and have puffiness in your vagina. This information is not intended to replace advice given to you by your health care provider. Make sure you discuss any questions you have with your health care provider. Document Released: 03/19/2004 Document Revised: 07/18/2015  Document Reviewed: 08/05/2012 Elsevier Interactive Patient Education  2017 Elsevier Inc. Prediabetes Eating Plan Prediabetes-also called impaired glucose tolerance or impaired fasting glucose-is a condition that causes blood sugar (blood glucose) levels to be higher than normal. Following a healthy diet can help to keep prediabetes under control. It can also help to lower the risk of type 2 diabetes and heart disease, which are increased in people who have prediabetes. Along with regular exercise, a healthy diet:  Promotes weight loss.  Helps to control blood sugar levels.  Helps to improve the way that the body uses insulin. What do I need to  know about this eating plan?  Use the glycemic index (GI) to plan your meals. The index tells you how quickly a food will raise your blood sugar. Choose low-GI foods. These foods take a longer time to raise blood sugar.  Pay close attention to the amount of carbohydrates in the food that you eat. Carbohydrates increase blood sugar levels.  Keep track of how many calories you take in. Eating the right amount of calories will help you to achieve a healthy weight. Losing about 7 percent of your starting weight can help to prevent type 2 diabetes.  You may want to follow a Mediterranean diet. This diet includes a lot of vegetables, lean meats or fish, whole grains, fruits, and healthy oils and fats. What foods can I eat? Grains  Whole grains, such as whole-wheat or whole-grain breads, crackers, cereals, and pasta. Unsweetened oatmeal. Bulgur. Barley. Quinoa. Brown rice. Corn or whole-wheat flour tortillas or taco shells. Vegetables  Lettuce. Spinach. Peas. Beets. Cauliflower. Cabbage. Broccoli. Carrots. Tomatoes. Squash. Eggplant. Herbs. Peppers. Onions. Cucumbers. Brussels sprouts. Fruits  Berries. Bananas. Apples. Oranges. Grapes. Papaya. Mango. Pomegranate. Kiwi. Grapefruit. Cherries. Meats and Other Protein Sources  Seafood. Lean meats, such as chicken and [place] or lean cuts of pork and beef. Tofu. Eggs. Nuts. Beans. Dairy  Low-fat or fat-free dairy products, such as yogurt, cottage cheese, and cheese. Beverages  Water. Tea. Coffee. Sugar-free or diet soda. Seltzer water. Milk. Milk alternatives, such as soy or almond milk. Condiments  Mustard. Relish. Low-fat, low-sugar ketchup. Low-fat, low-sugar barbecue sauce. Low-fat or fat-free mayonnaise. Sweets and Desserts  Sugar-free or low-fat pudding. Sugar-free or low-fat ice cream and other frozen treats. Fats and Oils  Avocado. Walnuts. Olive oil. The items listed above may not be a complete list of recommended foods or beverages. Contact  your dietitian for more options.  What foods are not recommended? Grains  Refined white flour and flour products, such as bread, pasta, snack foods, and cereals. Beverages  Sweetened drinks, such as sweet iced tea and soda. Sweets and Desserts  Baked goods, such as cake, cupcakes, pastries, cookies, and cheesecake. The items listed above may not be a complete list of foods and beverages to avoid. Contact your dietitian for more information.  This information is not intended to replace advice given to you by your health care provider. Make sure you discuss any questions you have with your health care provider. Document Released: 06/26/2014 Document Revised: 07/18/2015 Document Reviewed: 03/07/2014 Elsevier Interactive Patient Education  2017 [employer]

## 2016-03-06 NOTE — Progress Notes (Signed)
Chief Complaint  Patient presents with  . Dysuria    x 1 month  . lower abd pain    x 1 month  . left eye watery    x 1 month - burning sensation also  . pubic bump    ?ingrown hair    HPI  Dysuria Pt notices an intermittent discomfort at the end of urinary stream for one month. He denies history of urinary tract infection. He denies flank pain.  He denies nocturia but reports that in the day time he is urinating more often. Denies hesitance Denies penile discharge No dry mouth He tries to drink water to stay hydrated. He also has some lower abdominal pain that is intermittent and is not associated with urination. The abdominal pain is sharp.  He denies a history of kidney stones.  He reports that as a child he had blood in the urine that was benign.  Left eye  He states that he noted that his left eye is watery at times.  He denies purulent discharge He denies vision changes He wears contacts lenses or glasses He has not worn lenses in the past couple months His last eye exam was middle of 2017.  Pubic Bump He reports that yesterday he noted a painful bump that has a large hair in the middle of the bump  The bump feels raised It is tender to palpation.   Std screening He reports that he had intercourse a week ago  He reports steady partner  He denies history of stds He drivers trucks at night and though he is monogamous he just wanted to check.   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  He wakes up at 4pm in the afternoons. He states that he does not exercise and feels like he has gained 30 pounds He admits that he is a big guy and eats a lot at night.  He denies a family history of diabetes or heart disease.  He has never been diagnosed with diabetes or dyslipidemia.  He is fasting this morning.   Past Medical History:  Diagnosis Date  . Sleep apnea     No current outpatient prescriptions on file.   No current  facility-administered medications for this visit.     Allergies: No Known Allergies  Past Surgical History:  Procedure Laterality Date  . TONSILLECTOMY AND ADENOIDECTOMY  1994    Social History   Social History  . Marital status: Single    Spouse name: N/A  . Number of children: N/A  . Years of education: N/A   Social History Main Topics  . Smoking status: Former Games developer  . Smokeless tobacco: Never Used  . Alcohol use 1.8 oz/week    3 Cans of beer per week  . Drug use: No  . Sexual activity: Yes   Other Topics Concern  . None   Social History Narrative   Patient is single and lives at home with his father.   Patient works at [employer].   Patient has a high school education.   Patient is right-handed.   Patient drinks coffee once a week.          Review of Systems  Constitutional: Negative for chills and fever.  Eyes: Positive for discharge. Negative for blurred vision and double vision.  Respiratory: Negative for cough, shortness of breath and wheezing.   Cardiovascular: Negative for chest pain, palpitations and claudication.  Gastrointestinal: Positive for abdominal pain.  Negative for diarrhea, nausea and vomiting.  Genitourinary: Positive for dysuria and frequency. Negative for flank pain, hematuria and urgency.  Musculoskeletal: Negative.  Negative for back pain, joint pain and neck pain.  Skin: Negative for itching and rash.  Neurological: Negative for dizziness and headaches.  Psychiatric/Behavioral: Negative for depression. The patient is not nervous/anxious.     Objective: Vitals:   03/06/16 0804 03/06/16 0839  BP: (!) 158/100 138/90  Pulse: 82   Temp: 98.7 F (37.1 C)   TempSrc: Oral   SpO2: 94%   Weight: 254 lb (115.2 kg)   Height: 5' 11.25" (1.81 m)    BP Readings from Last 3 Encounters:  03/06/16 138/90  02/19/14 131/80  01/14/14 120/80   Body mass index is 35.18 kg/m. Wt Readings from Last 3 Encounters:  03/06/16 254 lb (115.2  kg)  02/19/14 257 lb (116.6 kg)  01/14/14 248 lb 3.2 oz (112.6 kg)     Physical Exam  Constitutional: He is oriented to person, place, and time. He appears well-developed and well-nourished.  HENT:  Head: Normocephalic and atraumatic.  Nose: Nose normal.  Mouth/Throat: Oropharynx is clear and moist.  Eyes: Conjunctivae and EOM are normal. Pupils are equal, round, and reactive to light. Right eye exhibits no discharge. Left eye exhibits no discharge. No scleral icterus.  Neck: Normal range of motion. Neck supple.  Cardiovascular: Normal rate, regular rhythm, normal heart sounds and intact distal pulses.   No murmur heard. Pulmonary/Chest: Effort normal and breath sounds normal. No respiratory distress.  Abdominal: Soft. Bowel sounds are normal. He exhibits no distension and no mass. There is no tenderness. There is no rebound and no guarding. No hernia.  Genitourinary: Rectum normal and penis normal. No penile tenderness.  Genitourinary Comments: Small lesion on the mons pubis at the base of a hair shaft No inguinal hernia noted Normal scrotal exam  Musculoskeletal: Normal range of motion. He exhibits no edema.  Neurological: He is alert and oriented to person, place, and time.  Skin: Skin is warm. No rash noted.  Psychiatric: He has a normal mood and affect. His behavior is normal. Judgment and thought content normal.    Assessment and Plan Joseph FearingJames was seen today for dysuria, lower abd pain, left eye watery and pubic bump.  Diagnoses and all orders for this visit:  Dysuria and Polyuria- will screen for urinary infection and diabetes -     POCT urinalysis dipstick -     POCT Microscopic Urinalysis ([facility]) -     POCT glycosylated hemoglobin (Hb A1C) Urine result shows microscopic hematuria Will send for urine culture  Screen for STD (sexually transmitted disease)- discussed std screening- verbal consent give for std screening -     GC/Chlamydia Probe Amp -     HIV antibody -      RPR -     Hepatitis B surface antibody -     Hepatitis B surface antigen  Prehypertension- discussed need for regular exercise Discussed that he should modify his diet Recheck in 6 months -     Comprehensive metabolic panel  Class 2 obesity due to excess calories with serious comorbidity and body mass index (BMI) of 35.0 to 35.9 in adult -    Discussed weight loss of 10 pounds  -    Discussed weight watchers app or myfitnesspal -     Lipid panel -     Comprehensive metabolic panel  Sedentary lifestyle- discussed that he should do some jumping jacks before and  after getting in the truck and each day increasing how much you do -     Lipid panel -     Comprehensive metabolic panel  Prediabetic- a1c is 5.8% Discussed dietary changes Recommend 6 months follow up and if no improvement will discuss metformin  Lower abdominal Pain- no hernia Discussed that this could be due to his urinary symptoms Benign abdominal exam  A total of 45 minutes were spent face-to-face with the patient during this encounter and over half of that time was spent on counseling and coordination of care.   Zoe A Stallings

## 2016-03-07 LAB — HEPATITIS B SURFACE ANTIBODY, QUANTITATIVE: Hepatitis B Surf Ab Quant: <3.1 m[IU]/mL — ABNORMAL LOW (ref >9.9)

## 2016-03-07 LAB — HEPATITIS B SURFACE ANTIGEN: Hepatitis B Surface Ag: NEGATIVE

## 2016-03-07 LAB — URINE CULTURE: ORGANISM ID, BACTERIA: NO GROWTH

## 2016-03-07 LAB — RPR: RPR: NONREACTIVE

## 2016-03-07 LAB — HIV ANTIBODY (ROUTINE TESTING W REFLEX): HIV SCREEN 4TH GENERATION: NONREACTIVE

## 2016-03-09 LAB — GC/CHLAMYDIA PROBE AMP
CHLAMYDIA, DNA PROBE: NEGATIVE
NEISSERIA GONORRHOEAE BY PCR: NEGATIVE

## 2016-03-13 ENCOUNTER — Telehealth: Payer: Self-pay

## 2016-03-13 NOTE — Telephone Encounter (Signed)
[phone]  Dr Creta LevinStallings  Please call patient with his test results.  He has questions for you.

## 2016-03-14 NOTE — Telephone Encounter (Signed)
Please advise 

## 2016-03-16 NOTE — Telephone Encounter (Signed)
Patient had a question about his urine test and the presents of a few bacteria. Explained that his urine culture was negative so there is no uti.  All his symptoms have resolved.

## 2016-09-16 ENCOUNTER — Ambulatory Visit: Payer: Managed Care, Other (non HMO) | Admitting: Family Medicine

## 2017-06-17 ENCOUNTER — Ambulatory Visit (INDEPENDENT_AMBULATORY_CARE_PROVIDER_SITE_OTHER): Payer: Managed Care, Other (non HMO) | Admitting: Family Medicine

## 2017-06-17 ENCOUNTER — Encounter: Payer: Self-pay | Admitting: Family Medicine

## 2017-06-17 ENCOUNTER — Other Ambulatory Visit: Payer: Self-pay

## 2017-06-17 VITALS — BP 142/85 | HR 93 | Temp 98.4°F | Resp 17 | Ht 71.25 in | Wt 255.8 lb

## 2017-06-17 DIAGNOSIS — R21 Rash and other nonspecific skin eruption: Secondary | ICD-10-CM | POA: Diagnosis not present

## 2017-06-17 DIAGNOSIS — Z113 Encounter for screening for infections with a predominantly sexual mode of transmission: Secondary | ICD-10-CM | POA: Diagnosis not present

## 2017-06-17 NOTE — Progress Notes (Signed)
Chief Complaint  Patient presents with  . std check    small rash on scotum    HPI   Pt reports that he noted a rash on his scrotum close to the base of his penis 2 days ago  He denies dysuria Testicular pain Penile discharge     Past Medical History:  Diagnosis Date  . Sleep apnea     No current outpatient medications on file.   No current facility-administered medications for this visit.     Allergies: No Known Allergies  Past Surgical History:  Procedure Laterality Date  . TONSILLECTOMY AND ADENOIDECTOMY  1994    Social History   Socioeconomic History  . Marital status: Single    Spouse name: Not on file  . Number of children: Not on file  . Years of education: Not on file  . Highest education level: Not on file  Occupational History  . Not on file  Social Needs  . Financial resource strain: Not on file  . Food insecurity:    Worry: Not on file    Inability: Not on file  . Transportation needs:    Medical: Not on file    Non-medical: Not on file  Tobacco Use  . Smoking status: Former Games developer  . Smokeless tobacco: Never Used  Substance and Sexual Activity  . Alcohol use: Yes    Alcohol/week: 1.8 oz    Types: 3 Cans of beer per week  . Drug use: No  . Sexual activity: Yes  Lifestyle  . Physical activity:    Days per week: Not on file    Minutes per session: Not on file  . Stress: Not on file  Relationships  . Social connections:    Talks on phone: Not on file    Gets together: Not on file    Attends religious service: Not on file    Active member of club or organization: Not on file    Attends meetings of clubs or organizations: Not on file    Relationship status: Not on file  Other Topics Concern  . Not on file  Social History Narrative   Patient is single and lives at home with his father.   Patient works at [employer].   Patient has a high school education.   Patient is right-handed.   Patient drinks coffee once a week.        No family history on file.   ROS Review of Systems See HPI Constitution: No fevers or chills No malaise No diaphoresis Skin: No rash or itching Eyes: no blurry vision, no double vision GU: no dysuria or hematuria Neuro: no dizziness or headaches  all others reviewed and negative   Objective: Vitals:   06/17/17 1417  BP: (!) 142/85  Pulse: 93  Resp: 17  Temp: 98.4 F (36.9 C)  TempSrc: Oral  SpO2: 97%  Weight: 255 lb 12.8 oz (116 kg)  Height: 5' 11.25" (1.81 m)    Physical Exam  Constitutional: He is oriented to person, place, and time. He appears well-developed and well-nourished.  HENT:  Head: Normocephalic and atraumatic.  Eyes: Conjunctivae and EOM are normal.  Pulmonary/Chest: Effort normal.  Genitourinary:  Genitourinary Comments: Chaperone present  Normal glans of penis  At the base of the penis close to the scrotum that is erythematous without a chancre or ulcer. Appear as though there was a previous raised surface.  Neurological: He is alert and oriented to person, place, and time.  Assessment and Plan Joseph Bullock was seen today for std check.  Diagnoses and all orders for this visit:  Screen for STD (sexually transmitted disease) -     RPR -     Hepatitis B surface antigen -     HIV antibody -     HSV(herpes simplex vrs) 1+2 ab-IgG -     GC/Chlamydia Probe Amp   Discussed that his lesion has the appearance of HSV but will check antibodies and complete full std screening  Advised pt that it does not look like jock itch Will discuss treatment after results are final He is to avoid intercourse until treated.  Joseph Bullock

## 2017-06-17 NOTE — Patient Instructions (Signed)
     IF you received an x-ray today, you will receive an invoice from [facility]. Please contact [facility] at [phone] with questions or concerns regarding your invoice.   IF you received labwork today, you will receive an invoice from [facility]. Please contact [facility] at [phone] with questions or concerns regarding your invoice.   Our billing staff will not be able to assist you with questions regarding bills from these companies.  You will be contacted with the lab results as soon as they are available. The fastest way to get your results is to activate your My Chart account. Instructions are located on the last page of this paperwork. If you have not heard from us regarding the results in 2 weeks, please contact this office.     

## 2017-06-18 ENCOUNTER — Other Ambulatory Visit: Payer: Self-pay | Admitting: Family Medicine

## 2017-06-18 LAB — HSV(HERPES SIMPLEX VRS) I + II AB-IGG
HSV 1 Glycoprotein G Ab, IgG: <0.91 index (ref 0.00–0.90)
HSV 2 IgG, Type Spec: <0.91 index (ref 0.00–0.90)

## 2017-06-18 LAB — RPR: RPR Ser Ql: NONREACTIVE

## 2017-06-18 LAB — HEPATITIS B SURFACE ANTIGEN: Hepatitis B Surface Ag: NEGATIVE

## 2017-06-18 LAB — HIV ANTIBODY (ROUTINE TESTING W REFLEX): HIV Screen 4th Generation wRfx: NONREACTIVE

## 2017-06-18 MED ORDER — MUPIROCIN CALCIUM 2 % EX CREA: 1.0000 "application " | TOPICAL_CREAM | Freq: Two times a day (BID) | CUTANEOUS | 0 refills | Status: DC

## 2017-06-19 LAB — GC/CHLAMYDIA PROBE AMP
Chlamydia trachomatis, NAA: NEGATIVE
Neisseria gonorrhoeae by PCR: NEGATIVE

## 2018-02-11 ENCOUNTER — Other Ambulatory Visit: Payer: Self-pay | Admitting: Emergency Medicine

## 2018-02-11 DIAGNOSIS — N6310 Unspecified lump in the right breast, unspecified quadrant: Secondary | ICD-10-CM

## 2018-03-10 ENCOUNTER — Ambulatory Visit
Admission: RE | Admit: 2018-03-10 | Discharge: 2018-03-10 | Disposition: A | Discharge status: Home / Self Care | Payer: Commercial Managed Care - PPO | Source: Ambulatory Visit | Attending: Emergency Medicine | Admitting: Emergency Medicine

## 2018-03-10 ENCOUNTER — Ambulatory Visit
Admission: RE | Admit: 2018-03-10 | Discharge: 2018-03-10 | Disposition: A | Discharge status: Home / Self Care | Payer: Managed Care, Other (non HMO) | Source: Ambulatory Visit | Attending: Emergency Medicine | Admitting: Emergency Medicine

## 2018-03-10 DIAGNOSIS — N6310 Unspecified lump in the right breast, unspecified quadrant: Secondary | ICD-10-CM

## 2018-04-11 ENCOUNTER — Other Ambulatory Visit: Payer: Self-pay | Admitting: Emergency Medicine

## 2018-04-11 ENCOUNTER — Other Ambulatory Visit: Payer: Self-pay | Admitting: Infectious Diseases

## 2018-09-06 ENCOUNTER — Other Ambulatory Visit: Payer: Self-pay | Admitting: Orthopedic Surgery

## 2018-10-21 NOTE — Patient Instructions (Addendum)
DUE TO COVID-19 ONLY ONE VISITOR IS ALLOWED TO COME WITH YOU AND STAY IN THE WAITING ROOM ONLY DURING PRE OP AND PROCEDURE DAY OF SURGERY. THE 1 VISITOR MAY VISIT WITH YOU AFTER SURGERY IN YOUR PRIVATE ROOM DURING VISITING HOURS ONLY!  YOU NEED TO HAVE A COVID 19 TEST ON___8-31____ @_215  pm_, THIS TEST MUST BE DONE BEFORE SURGERY, COME  [address], [place] , [id number].  [facility] ONCE YOUR COVID TEST IS COMPLETED, PLEASE BEGIN THE QUARANTINE INSTRUCTIONS AS OUTLINED IN YOUR HANDOUT.                Joseph PalauJames T Bullock  10/21/2018   Your procedure is scheduled on: 10-27-2018   [facility]              Report to admitting at 9:30AM     Call this number if you have problems the morning of surgery [phone]    Remember: Do not eat food  :After Midnight.              NO SOLID FOOD AFTER MIDNIGHT THE NIGHT PRIOR TO SURGERY. NOTHING BY MOUTH EXCEPT CLEAR LIQUIDS UNTIL 0830 am .              PLEASE FINISH ENSURE DRINK PER SURGEON ORDER  WHICH NEEDS TO BE COMPLETED AT 0830 am .   CLEAR LIQUID DIET   Foods Allowed                                                                     Foods Excluded  Coffee and tea, regular and decaf                             liquids that you cannot  Plain Jell-O any favor except red or purple                                           see through such as: Fruit ices (not with fruit pulp)                                     milk, soups, orange juice  Iced Popsicles                                    All solid food Carbonated beverages, regular and diet                                    Cranberry, grape and apple juices Sports drinks like Gatorade Lightly seasoned clear broth or consume(fat free) Sugar, honey syrup  Sample Menu Breakfast                                Lunch  Supper Cranberry juice                    Beef broth                            Chicken  broth Jell-O                                     Grape juice                           Apple juice Coffee or tea                        Jell-O                                      Popsicle                                                Coffee or tea                        Coffee or tea  _____________________________________________________________________               BRUSH YOUR TEETH MORNING OF SURGERY AND RINSE YOUR MOUTH OUT, NO CHEWING GUM CANDY OR MINTS.     Take these medicines the morning of surgery with A SIP OF WATER: NONE                                 You may not have any metal on your body including hair pins and              piercings  Do not wear jewelry, make-up, lotions, powders or perfumes, deodorant             Do not wear nail polish.  Do not shave  48 hours prior to surgery.              Men may shave face and neck.   Do not bring valuables to the hospital. [place].  Contacts, dentures or bridgework may not be worn into surgery.  Leave suitcase in the car. After surgery it may be brought to your room.     Patients discharged the day of surgery will not be allowed to drive home. IF YOU ARE HAVING SURGERY AND GOING HOME THE SAME DAY, YOU MUST HAVE AN ADULT TO DRIVE YOU HOME AND BE WITH YOU FOR 24 HOURS. YOU MAY GO HOME BY TAXI OR UBER OR ORTHERWISE, BUT AN ADULT MUST ACCOMPANY YOU HOME AND STAY WITH YOU FOR 24 HOURS.  Name and phone number of your driver:               Please read over the following fact sheets you were given: _____________________________________________________________________             [facility] -  Preparing for Surgery Before surgery, you can play an important role.  Because skin is not sterile, your skin needs to be as free of germs as possible.  You can reduce the number of germs on your skin by washing with CHG (chlorahexidine gluconate) soap before surgery.  CHG is an antiseptic  cleaner which kills germs and bonds with the skin to continue killing germs even after washing. Please DO NOT use if you have an allergy to CHG or antibacterial soaps.  If your skin becomes reddened/irritated stop using the CHG and inform your nurse when you arrive at Short Stay. Do not shave (including legs and underarms) for at least 48 hours prior to the first CHG shower.  You may shave your face/neck. Please follow these instructions carefully:  1.  Shower with CHG Soap the night before surgery and the  morning of Surgery.  2.  If you choose to wash your hair, wash your hair first as usual with your  normal  shampoo.  3.  After you shampoo, rinse your hair and body thoroughly to remove the  shampoo.                           4.  Use CHG as you would any other liquid soap.  You can apply chg directly  to the skin and wash                       Gently with a scrungie or clean washcloth.  5.  Apply the CHG Soap to your body ONLY FROM THE NECK DOWN.   Do not use on face/ open                           Wound or open sores. Avoid contact with eyes, ears mouth and genitals (private parts).                       Wash face,  Genitals (private parts) with your normal soap.             6.  Wash thoroughly, paying special attention to the area where your surgery  will be performed.  7.  Thoroughly rinse your body with warm water from the neck down.  8.  DO NOT shower/wash with your normal soap after using and rinsing off  the CHG Soap.                9.  Pat yourself dry with a clean towel.            10.  Wear clean pajamas.            11.  Place clean sheets on your bed the night of your first shower and do not  sleep with pets. Day of Surgery : Do not apply any lotions/deodorants the morning of surgery.  Please wear clean clothes to the hospital/surgery center.  FAILURE TO FOLLOW THESE INSTRUCTIONS MAY RESULT IN THE CANCELLATION OF YOUR SURGERY PATIENT  SIGNATURE_________________________________  NURSE SIGNATURE__________________________________  ________________________________________________________________________   Joseph Bullock  An incentive spirometer is a tool that can help keep your lungs clear and active. This tool measures how well you are filling your lungs with each breath. Taking long deep breaths may help reverse or decrease the chance of developing breathing (pulmonary) problems (especially infection) following:  A long period of time when you  are unable to move or be active. BEFORE THE PROCEDURE   If the spirometer includes an indicator to show your best effort, your nurse or respiratory therapist will set it to a desired goal.  If possible, sit up straight or lean slightly forward. Try not to slouch.  Hold the incentive spirometer in an upright position. INSTRUCTIONS FOR USE  1. Sit on the edge of your bed if possible, or sit up as far as you can in bed or on a chair. 2. Hold the incentive spirometer in an upright position. 3. Breathe out normally. 4. Place the mouthpiece in your mouth and seal your lips tightly around it. 5. Breathe in slowly and as deeply as possible, raising the piston or the ball toward the top of the column. 6. Hold your breath for 3-5 seconds or for as long as possible. Allow the piston or ball to fall to the bottom of the column. 7. Remove the mouthpiece from your mouth and breathe out normally. 8. Rest for a few seconds and repeat Steps 1 through 7 at least 10 times every 1-2 hours when you are awake. Take your time and take a few normal breaths between deep breaths. 9. The spirometer may include an indicator to show your best effort. Use the indicator as a goal to work toward during each repetition. 10. After each set of 10 deep breaths, practice coughing to be sure your lungs are clear. If you have an incision (the cut made at the time of surgery), support your incision when coughing  by placing a pillow or rolled up towels firmly against it. Once you are able to get out of bed, walk around indoors and cough well. You may stop using the incentive spirometer when instructed by your caregiver.  RISKS AND COMPLICATIONS  Take your time so you do not get dizzy or light-headed.  If you are in pain, you may need to take or ask for pain medication before doing incentive spirometry. It is harder to take a deep breath if you are having pain. AFTER USE  Rest and breathe slowly and easily.  It can be helpful to keep track of a log of your progress. Your caregiver can provide you with a simple table to help with this. If you are using the spirometer at home, follow these instructions: [place] IF:   You are having difficultly using the spirometer.  You have trouble using the spirometer as often as instructed.  Your pain medication is not giving enough relief while using the spirometer.  You develop fever of 100.5 F (38.1 C) or higher. SEEK IMMEDIATE MEDICAL CARE IF:   You cough up bloody sputum that had not been present before.  You develop fever of 102 F (38.9 C) or greater.  You develop worsening pain at or near the incision site. MAKE SURE YOU:   Understand these instructions.  Will watch your condition.  Will get help right away if you are not doing well or get worse. Document Released: 06/22/2006 Document Revised: 05/04/2011 Document Reviewed: 08/23/2006 [facility] Patient Information 2014 [place], [place].   ________________________________________________________________________

## 2018-10-24 ENCOUNTER — Other Ambulatory Visit (HOSPITAL_COMMUNITY)
Admission: RE | Admit: 2018-10-24 | Discharge: 2018-10-24 | Disposition: A | Discharge status: Home / Self Care | Payer: Commercial Managed Care - PPO | Source: Ambulatory Visit | Attending: Orthopedic Surgery | Admitting: Orthopedic Surgery

## 2018-10-24 ENCOUNTER — Encounter (HOSPITAL_COMMUNITY)
Admission: RE | Admit: 2018-10-24 | Discharge: 2018-10-24 | Disposition: A | Discharge status: Home / Self Care | Payer: Commercial Managed Care - PPO | Source: Ambulatory Visit | Attending: Orthopedic Surgery | Admitting: Orthopedic Surgery

## 2018-10-24 ENCOUNTER — Encounter (HOSPITAL_COMMUNITY): Payer: Self-pay

## 2018-10-24 ENCOUNTER — Other Ambulatory Visit: Payer: Self-pay

## 2018-10-24 DIAGNOSIS — X58XXXA Exposure to other specified factors, initial encounter: Secondary | ICD-10-CM | POA: Diagnosis not present

## 2018-10-24 DIAGNOSIS — Z87891 Personal history of nicotine dependence: Secondary | ICD-10-CM | POA: Diagnosis not present

## 2018-10-24 DIAGNOSIS — S29011A Strain of muscle and tendon of front wall of thorax, initial encounter: Secondary | ICD-10-CM | POA: Diagnosis present

## 2018-10-24 DIAGNOSIS — Z79899 Other long term (current) drug therapy: Secondary | ICD-10-CM | POA: Diagnosis not present

## 2018-10-24 DIAGNOSIS — Y939 Activity, unspecified: Secondary | ICD-10-CM | POA: Diagnosis not present

## 2018-10-24 DIAGNOSIS — Z01818 Encounter for other preprocedural examination: Secondary | ICD-10-CM | POA: Insufficient documentation

## 2018-10-24 DIAGNOSIS — Z20828 Contact with and (suspected) exposure to other viral communicable diseases: Secondary | ICD-10-CM | POA: Insufficient documentation

## 2018-10-24 DIAGNOSIS — G4730 Sleep apnea, unspecified: Secondary | ICD-10-CM | POA: Diagnosis not present

## 2018-10-24 DIAGNOSIS — Z6836 Body mass index (BMI) 36.0-36.9, adult: Secondary | ICD-10-CM | POA: Diagnosis not present

## 2018-10-24 LAB — CBC WITH DIFFERENTIAL/PLATELET
Abs Immature Granulocytes: 0.03 10*3/uL (ref 0.00–0.07)
Basophils Absolute: 0 10*3/uL (ref 0.0–0.1)
Basophils Relative: 0 %
Eosinophils Absolute: 0.1 10*3/uL (ref 0.0–0.5)
Eosinophils Relative: 1 %
HCT: 48.3 % (ref 39.0–52.0)
Hemoglobin: 15.7 g/dL (ref 13.0–17.0)
Immature Granulocytes: 0 %
Lymphocytes Relative: 24 %
Lymphs Abs: 2.1 10*3/uL (ref 0.7–4.0)
MCH: 28.6 pg (ref 26.0–34.0)
MCHC: 32.5 g/dL (ref 30.0–36.0)
MCV: 88 fL (ref 80.0–100.0)
Monocytes Absolute: 0.6 10*3/uL (ref 0.1–1.0)
Monocytes Relative: 6 %
Neutro Abs: 5.9 10*3/uL (ref 1.7–7.7)
Neutrophils Relative %: 69 %
Platelets: 243 10*3/uL (ref 150–400)
RBC: 5.49 MIL/uL (ref 4.22–5.81)
RDW: 12.7 % (ref 11.5–15.5)
WBC: 8.7 10*3/uL (ref 4.0–10.5)
nRBC: 0 % (ref 0.0–0.2)

## 2018-10-24 LAB — COMPREHENSIVE METABOLIC PANEL
ALT: 42 U/L (ref 0–44)
AST: 30 U/L (ref 15–41)
Albumin: 4.4 g/dL (ref 3.5–5.0)
Alkaline Phosphatase: 54 U/L (ref 38–126)
Anion gap: 10 (ref 5–15)
BUN: 18 mg/dL (ref 6–20)
CO2: 26 mmol/L (ref 22–32)
Calcium: 9.4 mg/dL (ref 8.9–10.3)
Chloride: 102 mmol/L (ref 98–111)
Creatinine, Ser: 1.38 mg/dL — ABNORMAL HIGH (ref 0.61–1.24)
GFR calc Af Amer: >60 mL/min (ref >60)
GFR calc non Af Amer: >60 mL/min (ref >60)
Glucose, Bld: 166 mg/dL — ABNORMAL HIGH (ref 70–99)
Potassium: 3.8 mmol/L (ref 3.5–5.1)
Sodium: 138 mmol/L (ref 135–145)
Total Bilirubin: 0.9 mg/dL (ref 0.3–1.2)
Total Protein: 7.9 g/dL (ref 6.5–8.1)

## 2018-10-24 LAB — PROTIME-INR
INR: 1 (ref 0.8–1.2)
Prothrombin Time: 12.9 seconds (ref 11.4–15.2)

## 2018-10-24 LAB — URINALYSIS, ROUTINE W REFLEX MICROSCOPIC
Bacteria, UA: NONE SEEN
Bilirubin Urine: NEGATIVE
Glucose, UA: 50 mg/dL — AB
Ketones, ur: 5 mg/dL — AB
Nitrite: NEGATIVE
Protein, ur: NEGATIVE mg/dL
Specific Gravity, Urine: 1.02 (ref 1.005–1.030)
pH: 5 (ref 5.0–8.0)

## 2018-10-24 LAB — APTT: aPTT: 27 seconds (ref 24–36)

## 2018-10-24 LAB — HEMOGLOBIN A1C
Hgb A1c MFr Bld: 5.9 % — ABNORMAL HIGH (ref 4.8–5.6)
Mean Plasma Glucose: 122.63 mg/dL

## 2018-10-24 NOTE — Progress Notes (Addendum)
PCP - Dr. Delia Chimes Cardiologist -N/A   Chest x-ray - N/A EKG -03/09/2012- repeated 10/24/2018  Stress Test - N/A ECHO - N/A Cardiac Cath - N/A  Sleep Study - 2011 CPAP -used CPAP until 02/19/2014 -had an office visit with Dr. Brett Fairy on 02/19/2014.No CPAP use since Deviated Septum surgery.   Fasting Blood Sugar - N/A Checks Blood Sugar _____ times a day  Blood Thinner Instructions: Aspirin Instructions:N/A Last Dose:  Anesthesia review:  Chart given to Konrad Felix, PA to review . Ordered HgA1C as glucose from CMP was 166 and UA-glucose was 50. Patient denies diabetes.   Patient denies shortness of breath, fever, cough and chest pain at PAT appointment   Patient verbalized understanding of instructions that were given to them at the PAT appointment. Patient was also instructed that they will need to review over the PAT instructions again at home before surgery.

## 2018-10-25 LAB — SARS CORONAVIRUS 2 (TAT 6-24 HRS): SARS Coronavirus 2: NEGATIVE

## 2018-10-26 MED ORDER — DEXTROSE 5 % IV SOLN
3.0000 g | INTRAVENOUS | Status: AC
  Administered 2018-10-27: 3 g via INTRAVENOUS
  Filled 2018-10-26: qty 3

## 2018-10-27 ENCOUNTER — Ambulatory Visit (HOSPITAL_COMMUNITY)
Admission: RE | Admit: 2018-10-27 | Discharge: 2018-10-27 | Disposition: A | Discharge status: Home / Self Care | Payer: Commercial Managed Care - PPO | Attending: Orthopedic Surgery | Admitting: Orthopedic Surgery

## 2018-10-27 ENCOUNTER — Encounter (HOSPITAL_COMMUNITY): Payer: Self-pay | Admitting: General Practice

## 2018-10-27 ENCOUNTER — Encounter (HOSPITAL_COMMUNITY)
Admission: RE | Disposition: A | Discharge status: Home / Self Care | Payer: Self-pay | Source: Home / Self Care | Attending: Orthopedic Surgery

## 2018-10-27 ENCOUNTER — Ambulatory Visit (HOSPITAL_COMMUNITY): Payer: Commercial Managed Care - PPO | Admitting: Physician Assistant

## 2018-10-27 ENCOUNTER — Ambulatory Visit (HOSPITAL_COMMUNITY): Payer: Commercial Managed Care - PPO | Admitting: Certified Registered Nurse Anesthetist

## 2018-10-27 DIAGNOSIS — G4730 Sleep apnea, unspecified: Secondary | ICD-10-CM | POA: Insufficient documentation

## 2018-10-27 DIAGNOSIS — Z87891 Personal history of nicotine dependence: Secondary | ICD-10-CM | POA: Insufficient documentation

## 2018-10-27 DIAGNOSIS — Y939 Activity, unspecified: Secondary | ICD-10-CM | POA: Insufficient documentation

## 2018-10-27 DIAGNOSIS — Z6836 Body mass index (BMI) 36.0-36.9, adult: Secondary | ICD-10-CM | POA: Insufficient documentation

## 2018-10-27 DIAGNOSIS — X58XXXA Exposure to other specified factors, initial encounter: Secondary | ICD-10-CM | POA: Insufficient documentation

## 2018-10-27 DIAGNOSIS — Z20828 Contact with and (suspected) exposure to other viral communicable diseases: Secondary | ICD-10-CM | POA: Insufficient documentation

## 2018-10-27 DIAGNOSIS — S29011A Strain of muscle and tendon of front wall of thorax, initial encounter: Secondary | ICD-10-CM | POA: Insufficient documentation

## 2018-10-27 DIAGNOSIS — Z79899 Other long term (current) drug therapy: Secondary | ICD-10-CM | POA: Insufficient documentation

## 2018-10-27 HISTORY — PX: BICEPS TENDON REPAIR: SHX566

## 2018-10-27 SURGERY — REPAIR, TENDON, BICEPS, PROXIMAL
Anesthesia: General | Site: Shoulder | Laterality: Right

## 2018-10-27 MED ORDER — PHENYLEPHRINE HCL (PRESSORS) 10 MG/ML IV SOLN
INTRAVENOUS | Status: AC
  Filled 2018-10-27: qty 2

## 2018-10-27 MED ORDER — LIDOCAINE 2% (20 MG/ML) 5 ML SYRINGE
INTRAMUSCULAR | Status: DC | PRN
  Administered 2018-10-27: 40 mg via INTRAVENOUS

## 2018-10-27 MED ORDER — PROPOFOL 10 MG/ML IV BOLUS
INTRAVENOUS | Status: AC
  Filled 2018-10-27: qty 20

## 2018-10-27 MED ORDER — FENTANYL CITRATE (PF) 100 MCG/2ML IJ SOLN
INTRAMUSCULAR | Status: AC
  Filled 2018-10-27: qty 2

## 2018-10-27 MED ORDER — TRANEXAMIC ACID-NACL 1000-0.7 MG/100ML-% IV SOLN
1000.0000 mg | INTRAVENOUS | Status: AC
  Administered 2018-10-27: 1000 mg via INTRAVENOUS
  Filled 2018-10-27: qty 100

## 2018-10-27 MED ORDER — ROCURONIUM BROMIDE 10 MG/ML (PF) SYRINGE
PREFILLED_SYRINGE | INTRAVENOUS | Status: DC | PRN
  Administered 2018-10-27: 50 mg via INTRAVENOUS
  Administered 2018-10-27: 10 mg via INTRAVENOUS
  Administered 2018-10-27: 20 mg via INTRAVENOUS

## 2018-10-27 MED ORDER — MIDAZOLAM HCL 2 MG/2ML IJ SOLN
INTRAMUSCULAR | Status: AC
  Filled 2018-10-27: qty 2

## 2018-10-27 MED ORDER — MIDAZOLAM HCL 2 MG/2ML IJ SOLN
1.0000 mg | INTRAMUSCULAR | Status: DC
  Administered 2018-10-27: 12:00:00 2 mg via INTRAVENOUS

## 2018-10-27 MED ORDER — PHENYLEPHRINE 40 MCG/ML (10ML) SYRINGE FOR IV PUSH (FOR BLOOD PRESSURE SUPPORT)
PREFILLED_SYRINGE | INTRAVENOUS | Status: DC | PRN
  Administered 2018-10-27 (×2): 120 ug via INTRAVENOUS
  Administered 2018-10-27 (×2): 80 ug via INTRAVENOUS
  Administered 2018-10-27: 120 ug via INTRAVENOUS

## 2018-10-27 MED ORDER — BUPIVACAINE LIPOSOME 1.3 % IJ SUSP
INTRAMUSCULAR | Status: DC | PRN
  Administered 2018-10-27: 10 mL via PERINEURAL

## 2018-10-27 MED ORDER — MIDAZOLAM HCL 5 MG/5ML IJ SOLN
INTRAMUSCULAR | Status: DC | PRN
  Administered 2018-10-27 (×2): 1 mg via INTRAVENOUS

## 2018-10-27 MED ORDER — SODIUM CHLORIDE 0.9 % IR SOLN
Status: DC | PRN
  Administered 2018-10-27: 1000 mL

## 2018-10-27 MED ORDER — PROMETHAZINE HCL 25 MG/ML IJ SOLN
INTRAMUSCULAR | Status: AC
  Filled 2018-10-27: qty 1

## 2018-10-27 MED ORDER — EPHEDRINE SULFATE-NACL 50-0.9 MG/10ML-% IV SOSY
PREFILLED_SYRINGE | INTRAVENOUS | Status: DC | PRN
  Administered 2018-10-27 (×2): 10 mg via INTRAVENOUS

## 2018-10-27 MED ORDER — ONDANSETRON HCL 4 MG/2ML IJ SOLN
INTRAMUSCULAR | Status: DC | PRN
  Administered 2018-10-27: 4 mg via INTRAVENOUS

## 2018-10-27 MED ORDER — DEXAMETHASONE SODIUM PHOSPHATE 10 MG/ML IJ SOLN
INTRAMUSCULAR | Status: DC | PRN
  Administered 2018-10-27: 5 mg via INTRAVENOUS

## 2018-10-27 MED ORDER — BUPIVACAINE-EPINEPHRINE (PF) 0.5% -1:200000 IJ SOLN
INTRAMUSCULAR | Status: DC | PRN
  Administered 2018-10-27: 15 mL via PERINEURAL

## 2018-10-27 MED ORDER — HYDROMORPHONE HCL 1 MG/ML IJ SOLN: 0.5000 mg | INTRAMUSCULAR | Status: DC | PRN

## 2018-10-27 MED ORDER — SUGAMMADEX SODIUM 500 MG/5ML IV SOLN
INTRAVENOUS | Status: AC
  Filled 2018-10-27: qty 5

## 2018-10-27 MED ORDER — TIZANIDINE HCL 4 MG PO TABS: 4.0000 mg | ORAL_TABLET | Freq: Three times a day (TID) | ORAL | 1 refills | Status: DC | PRN

## 2018-10-27 MED ORDER — LACTATED RINGERS IV SOLN
INTRAVENOUS | Status: DC
  Administered 2018-10-27 (×3): via INTRAVENOUS

## 2018-10-27 MED ORDER — CHLORHEXIDINE GLUCONATE 4 % EX LIQD: 60.0000 mL | Freq: Once | CUTANEOUS | Status: DC

## 2018-10-27 MED ORDER — ONDANSETRON HCL 4 MG/2ML IJ SOLN
INTRAMUSCULAR | Status: AC
  Filled 2018-10-27: qty 2

## 2018-10-27 MED ORDER — MEPERIDINE HCL 50 MG/ML IJ SOLN: 6.2500 mg | INTRAMUSCULAR | Status: DC | PRN

## 2018-10-27 MED ORDER — STERILE WATER FOR IRRIGATION IR SOLN
Status: DC | PRN
  Administered 2018-10-27: 1000 mL

## 2018-10-27 MED ORDER — MIDAZOLAM HCL 2 MG/2ML IJ SOLN: 0.5000 mg | Freq: Once | INTRAMUSCULAR | Status: DC | PRN

## 2018-10-27 MED ORDER — SUCCINYLCHOLINE CHLORIDE 20 MG/ML IJ SOLN
INTRAMUSCULAR | Status: DC | PRN
  Administered 2018-10-27: 140 mg via INTRAVENOUS

## 2018-10-27 MED ORDER — PROMETHAZINE HCL 25 MG/ML IJ SOLN
6.2500 mg | INTRAMUSCULAR | Status: DC | PRN
  Administered 2018-10-27: 15:00:00 12.5 mg via INTRAVENOUS

## 2018-10-27 MED ORDER — SUGAMMADEX SODIUM 200 MG/2ML IV SOLN
INTRAVENOUS | Status: DC | PRN
  Administered 2018-10-27: 300 mg via INTRAVENOUS

## 2018-10-27 MED ORDER — SODIUM CHLORIDE 0.9 % IV SOLN
INTRAVENOUS | Status: DC | PRN
  Administered 2018-10-27: 40 ug/min via INTRAVENOUS

## 2018-10-27 MED ORDER — OXYCODONE-ACETAMINOPHEN 5-325 MG PO TABS: 1.0000 | ORAL_TABLET | ORAL | 0 refills | Status: DC | PRN

## 2018-10-27 MED ORDER — FENTANYL CITRATE (PF) 250 MCG/5ML IJ SOLN
INTRAMUSCULAR | Status: AC
  Filled 2018-10-27: qty 5

## 2018-10-27 MED ORDER — PROPOFOL 10 MG/ML IV BOLUS
INTRAVENOUS | Status: DC | PRN
  Administered 2018-10-27: 300 mg via INTRAVENOUS

## 2018-10-27 MED ORDER — FENTANYL CITRATE (PF) 100 MCG/2ML IJ SOLN
INTRAMUSCULAR | Status: DC | PRN
  Administered 2018-10-27 (×2): 50 ug via INTRAVENOUS

## 2018-10-27 MED ORDER — FENTANYL CITRATE (PF) 100 MCG/2ML IJ SOLN
50.0000 ug | INTRAMUSCULAR | Status: DC
  Administered 2018-10-27: 12:00:00 100 ug via INTRAVENOUS

## 2018-10-27 SURGICAL SUPPLY — 55 items
BIT DRILL PIN BICEPSBTTN 3.2MM (BIT) ×1 IMPLANT
BLADE CLIPPER SURG (BLADE) ×3 IMPLANT
BNDG ELASTIC 3X5.8 VLCR STR LF (GAUZE/BANDAGES/DRESSINGS) IMPLANT
BNDG ELASTIC 4X5.8 VLCR STR LF (GAUZE/BANDAGES/DRESSINGS) IMPLANT
BNDG ESMARK 4X9 LF (GAUZE/BANDAGES/DRESSINGS) IMPLANT
BNDG GAUZE ELAST 4 BULKY (GAUZE/BANDAGES/DRESSINGS) IMPLANT
BUR OVAL CARBIDE 4.0 (BURR) ×3 IMPLANT
BUTTON SUT LRG EYELET PER REP (Button) ×6 IMPLANT
CHLORAPREP W/TINT 26 (MISCELLANEOUS) ×6 IMPLANT
CLOSURE STERI-STRIP 1/2X4 (GAUZE/BANDAGES/DRESSINGS) ×1
CLSR STERI-STRIP ANTIMIC 1/2X4 (GAUZE/BANDAGES/DRESSINGS) ×2 IMPLANT
COVER SURGICAL LIGHT HANDLE (MISCELLANEOUS) ×3 IMPLANT
COVER WAND RF STERILE (DRAPES) IMPLANT
CUFF TOURN SGL QUICK 24 (TOURNIQUET CUFF)
CUFF TRNQT CYL 24X4X16.5-23 (TOURNIQUET CUFF) IMPLANT
DRAPE OEC MINIVIEW 54X84 (DRAPES) IMPLANT
DRAPE SURG 17X23 STRL (DRAPES) ×3 IMPLANT
DRAPE U-SHAPE 47X51 STRL (DRAPES) ×3 IMPLANT
DRILL PIN BICEPSBUTTON 3.2MM (BIT) ×3
DRSG AQUACEL AG ADV 3.5X 6 (GAUZE/BANDAGES/DRESSINGS) ×3 IMPLANT
ELECT BLADE TIP CTD 4 INCH (ELECTRODE) ×3 IMPLANT
ELECT REM PT RETURN 15FT ADLT (MISCELLANEOUS) ×3 IMPLANT
FIBER TAPE 2MM (SUTURE) ×6 IMPLANT
GAUZE XEROFORM 5X9 LF (GAUZE/BANDAGES/DRESSINGS) IMPLANT
GLOVE BIO SURGEON STRL SZ7.5 (GLOVE) ×3 IMPLANT
GLOVE BIO SURGEON STRL SZ8 (GLOVE) ×3 IMPLANT
GLOVE BIOGEL PI IND STRL 8 (GLOVE) ×1 IMPLANT
GLOVE BIOGEL PI INDICATOR 8 (GLOVE) ×2
GLOVE ECLIPSE 7.5 STRL STRAW (GLOVE) ×3 IMPLANT
GOWN STRL REUS W/ TWL XL LVL3 (GOWN DISPOSABLE) ×1 IMPLANT
GOWN STRL REUS W/TWL LRG LVL3 (GOWN DISPOSABLE) ×3 IMPLANT
GOWN STRL REUS W/TWL XL LVL3 (GOWN DISPOSABLE) ×2
GRAFT ACHILLES TENDON (Bone Implant) ×3 IMPLANT
KIT BASIN OR (CUSTOM PROCEDURE TRAY) ×3 IMPLANT
KIT TURNOVER KIT A (KITS) IMPLANT
MANIFOLD NEPTUNE II (INSTRUMENTS) ×3 IMPLANT
NEEDLE HYPO 22GX1.5 SAFETY (NEEDLE) ×3 IMPLANT
NEEDLE TAPERED W/ NITINOL LOOP (MISCELLANEOUS) ×3 IMPLANT
PACK ORTHO EXTREMITY (CUSTOM PROCEDURE TRAY) ×3 IMPLANT
PAD CAST 4YDX4 CTTN HI CHSV (CAST SUPPLIES) IMPLANT
PADDING CAST COTTON 4X4 STRL (CAST SUPPLIES)
PROTECTOR NERVE ULNAR (MISCELLANEOUS) ×3 IMPLANT
SLING ARM FOAM STRAP XLG (SOFTGOODS) ×3 IMPLANT
SPONGE LAP 4X18 RFD (DISPOSABLE) IMPLANT
SUCTION FRAZIER HANDLE 10FR (MISCELLANEOUS) ×2
SUCTION TUBE FRAZIER 10FR DISP (MISCELLANEOUS) ×1 IMPLANT
SUPPORT WRAP ARM LG (MISCELLANEOUS) ×3 IMPLANT
SUT ETHILON 4 0 PS 2 18 (SUTURE) IMPLANT
SUT MNCRL AB 4-0 PS2 18 (SUTURE) ×3 IMPLANT
SUT VIC AB 0 CT1 36 (SUTURE) ×3 IMPLANT
SUT VIC AB 2-0 CT1 27 (SUTURE) ×2
SUT VIC AB 2-0 CT1 TAPERPNT 27 (SUTURE) ×1 IMPLANT
SUTURE TAPE 1.3 40 TPR END (SUTURE) ×2 IMPLANT
SUTURETAPE 1.3 40 TPR END (SUTURE) ×6
SYR CONTROL 10ML LL (SYRINGE) ×3 IMPLANT

## 2018-10-27 NOTE — Progress Notes (Signed)
Assisted  Dr. Annye Asa with left shoulder block. Side rails up, monitors on throughout procedure. See vital signs in flow sheet. Tolerated Procedure well.

## 2018-10-27 NOTE — Transfer of Care (Signed)
Immediate Anesthesia Transfer of Care Note  Patient: Joseph Bullock  Procedure(s) Performed: PECTORALIS RECONSTRUCTION WITH ALLOGRAFT (Right Shoulder)  Patient Location: PACU  Anesthesia Type:General  Level of Consciousness: sedated, patient cooperative and responds to stimulation  Airway & Oxygen Therapy: Patient Spontanous Breathing and Patient connected to face mask oxygen  Post-op Assessment: Report given to RN and Post -op Vital signs reviewed and stable  Post vital signs: Reviewed and stable  Last Vitals:  Vitals Value Taken Time  BP    Temp    Pulse    Resp    SpO2      Last Pain:  Vitals:   10/27/18 0945  TempSrc:   PainSc: 0-No pain         Complications: No apparent anesthesia complications

## 2018-10-27 NOTE — Anesthesia Procedure Notes (Signed)
Anesthesia Regional Block: Supraclavicular block   Pre-Anesthetic Checklist: ,, timeout performed, Correct Patient, Correct Site, Correct Laterality, Correct Procedure, Correct Position, site marked, Risks and benefits discussed,  Surgical consent,  Pre-op evaluation,  At surgeon's request and post-op pain management  Laterality: Right and Upper  Prep: chloraprep       Needles:  Injection technique: Single-shot  Needle Type: Echogenic Stimulator Needle     Needle Length: 9cm  Needle Gauge: 21     Additional Needles:   Procedures:, nerve stimulator,,, ultrasound used (permanent image in chart),,,,   Nerve Stimulator or Paresthesia:  Response: hand twitch, 0.4 mA, 0.1 ms,   Additional Responses:   Narrative:  Start time: 10/27/2018 11:42 AM End time: 10/27/2018 11:48 AM Injection made incrementally with aspirations every 5 mL.  Performed by: Personally  Anesthesiologist: Annye Asa, MD  Additional Notes: Pt identified in Holding room.  Monitors applied. Working IV access confirmed. Sterile prep R clavicle and neck.  #21ga ECHOgenic PNS to hand twitch at 0.4mA threshold with US guidance.  15cc 0.5% Bupivacaine with 1:200k, 10cc Exparel epi injected incrementally after negative test dose.  Patient asymptomatic, VSS, no heme aspirated, tolerated well.  Jenita Seashore, MD

## 2018-10-27 NOTE — Discharge Instructions (Signed)
Discharge Instructions after Open Shoulder Repair  A sling has been provided for you. Remain in your sling at all times. This includes sleeping in your sling.  Use ice on the shoulder intermittently over the first 48 hours after surgery.  Pain medicine has been prescribed for you.  Use your medicine liberally over the first 48 hours, and then you can begin to taper your use. You may take Extra Strength Tylenol or Tylenol only in place of the pain pills. DO NOT take ANY nonsteroidal anti-inflammatory pain medications: Advil, Motrin, Ibuprofen, Aleve, Naproxen or Naprosyn.  Your dressing is waterproof. It is okay to shower with it on as long as it is intact. Please leave it on until your first visit back at the clinic.  Take one aspirin, a day for 2 weeks after surgery, unless you have an aspirin sensitivity/ allergy or asthma.   Please call [phone] during normal business hours or [phone] after hours for any problems. Including the following:  - excessive redness of the incisions - drainage for more than 4 days - fever of more than 101.5 F  *Please note that pain medications will not be refilled after hours or on weekends.

## 2018-10-27 NOTE — H&P (Signed)
Joseph Bullock is an 33 y.o. male.   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  Past Medical History:  Diagnosis Date  . Sleep apnea 2016   used CPAP for 2 years- have not used CPAP in 2 years    Past Surgical History:  Procedure Laterality Date  . NASAL SEPTUM SURGERY  2018  . TONSILLECTOMY AND ADENOIDECTOMY  1994    History reviewed. No pertinent family history. Social History:  reports that he quit smoking about 8 years ago. His smoking use included cigarettes. He has never used smokeless tobacco. He reports current alcohol use of about 3.0 standard drinks of alcohol per week. He reports that he does not use drugs.  Allergies: No Known Allergies  Medications Prior to Admission  Medication Sig Dispense Refill  . nitrofurantoin, macrocrystal-monohydrate, (MACROBID) 100 MG capsule Take 100 mg by mouth 2 (two) times daily. Started 10/22/2018 and to finish on 10/26/2018    . sulfamethoxazole-trimethoprim (BACTRIM DS) 800-160 MG tablet Take 1 tablet by mouth 2 (two) times daily. Started 10/22/2018 and to finish Wednesday 10/26/2018      No results found for this or any previous visit (from the past 48 hour(s)). No results found.  Review of Systems  All other systems reviewed and are negative.   Blood pressure (!) 157/109, pulse 83, temperature 98.4 F (36.9 C), temperature source Oral, resp. rate 18, height 6' (1.829 m), weight 122.5 kg, SpO2 99 %. Physical Exam  Constitutional: He is oriented to person, place, and time. He appears well-developed and well-nourished.  HENT:  Head: Atraumatic.  Eyes: EOM are normal.  Cardiovascular: Intact distal pulses.  Respiratory: Effort normal.  Musculoskeletal:     Comments: R shoulder/chest with abnormal contour of axillary fold.  Neurological: He is alert and oriented to person, place, and time.  Skin: Skin is warm and dry.  Psychiatric: He has a normal mood and  affect.     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 Plan R pectoralis repair, likely will need allograft Risks / benefits of surgery discussed Consent on chart  NPO for OR Preop antibiotics   Isabella Stalling, MD 10/27/2018, 11:40 AM

## 2018-10-27 NOTE — Op Note (Signed)
Procedure(s): PECTORALIS RECONSTRUCTION WITH ALLOGRAFT Procedure Note  Joseph Bullock male 33 y.o. 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 male right pectoralis major tear which was initially missed after injury in October 2019.  He went on to have significant weakness and deformity consistent with pectoralis major tear.  Ultimately was diagnosed with an MRI.  I had a long discussion with the patient regarding expectations regarding operative versus nonoperative management at this point.  He was not satisfied with his strength and was unable to return to his desired activity levels wished to proceed with reconstruction.      Surgeon: Isabella Stalling   Assistants: Jeanmarie Hubert PA-C [facility] was present and scrubbed throughout the procedure and was essential in positioning, retraction, exposure, and closure)  Anesthesia: General endotracheal anesthesia with preoperative interscalene block given by the attending anesthesiologist   Procedure Detail  PECTORALIS RECONSTRUCTION WITH ALLOGRAFT  Findings: The clavicular head of the pectoralis major remained intact.  The underlying sternal head was chronically torn and retracted.  I was able to find the edge of the muscle belly which had about 1 cm of tendinous stump remaining.  I was able to reconstruct the gap using Achilles tendon allograft.  Fixation of the humerus was with Arthrex unicortical buttons.  Estimated Blood Loss:  less than 50 mL         Drains: None   Blood Given: none          Specimens: none        Complications:  * No complications entered in OR log *         Disposition: PACU - hemodynamically stable.         Condition: stable    Procedure:   The patient was  identified in the preoperative holding area where I personally marked the operative extremity after verifying with the patient and consent. He  was taken to the operating room where He was transferred to the   operative table.  The patient received an interscalene block in   the holding area by the attending anesthesiologist.  General anesthesia was induced   in the operating room without complication.  The patient did receive IV  Ancef prior to the commencement of the procedure.  The patient was   placed in the beach-chair position with the back raised about 30   degrees.  The nonoperative extremity and head and neck were carefully   positioned and padded protecting against neurovascular compromise.  The  right upper extremity was then prepped and draped in the standard sterile   fashion.   The appropriate operative time-out was performed with   Anesthesia, the perioperative staff, as well as myself and we all agreed   that the right side was the correct operative site.   A approximately 8 cm incision was made along the deltopectoral interval centered at the axillary fold.  Dissection was carried down through subcutaneous tissues and the cephalic vein identified the deltopectoral interval.  The clavicular head of the pectoralis was identified and was noted to be intact.  The clavicular head was then retracted laterally to expose the expected area of the underlying sternal head which was absent.  Careful dissection was used medially subcutaneously to try and find the  stump of the sternal head.  Ultimately the stump was identified slightly scarred to the medial aspect of the clavicular head.  It was teased off of here and there was fairly good excursion of the musculotendinous unit after freeing up surrounding adhesions.  There was really very little actual tendon remaining but the stump did have some very hard tissue which would hold sutures. Therefore the decision was made to proceed with allograft  reconstruction.  The Achilles tendon allograft was first sewn to the distal stump of the torn pectoralis major with locking Krakw configuration's folding around the tendon and completely. At this point the tendon fixation to the allograft was felt to be excellent and the sutures were holding nicely.  This was done with suture tapes. The allograft was then pulled over to the humerus just lateral to the long head biceps tendon to estimate the desired length.  The end was then prepared with 2 locking Krakw configuration sutures using fiber tapes.  The remaining length of the allograft was cut and discarded.  At this point 2 small drill holes were made over the insertion site just lateral to the long head biceps tendon after burring down to a bleeding surface to promote healing.  The 2 unicortical buttons were then loaded and placed.  These offered secure fixation and using a tension slide technique were able to bring down the tendon gradually to the prepared surface.  This nicely re-created expected tension but was not too tight.  At this point the sutures were tied and cut.  The final construct was felt to be excellent.  The clavicular head was then allowed to fall back over top of the newly reconstructed sternal head.  The copious irrigation was used and the wound was closed in layers using 2-0 Vicryl and 4-0 Monocryl.  Steri-Strips were applied and a light sterile dressing.  Patient was allowed to awaken from anesthesia transferred to the stretcher and taken to the recovery room in stable condition.    POSTOPERATIVE PLAN: He will be observed in the recovery room and if pain control is adequate he will be discharged home today with his family.  He will follow-up in a couple weeks for wound check.  He is to remain in his sling for 6 weeks postoperatively just working on hand wrist and elbow motion to allow the construct to heal.

## 2018-10-27 NOTE — Anesthesia Preprocedure Evaluation (Addendum)
Anesthesia Evaluation  Patient identified by MRN, date of birth, ID band Patient awake    Reviewed: Allergy & Precautions, NPO status , Patient's Chart, lab work & pertinent test results  History of Anesthesia Complications Negative for: history of anesthetic complications  Airway Mallampati: I  TM Distance: >3 FB Neck ROM: Full    Dental  (+) Dental Advisory Given   Pulmonary sleep apnea (no longer uses CPAP) , former smoker (quit 2011),  10/24/2018 SARS coronavirus NEG   breath sounds clear to auscultation       Cardiovascular (-) anginanegative cardio ROS   Rhythm:Regular Rate:Normal     Neuro/Psych negative neurological ROS     GI/Hepatic negative GI ROS, Neg liver ROS,   Endo/Other  Morbid obesity  Renal/GU negative Renal ROS     Musculoskeletal   Abdominal (+) + obese,   Peds  Hematology negative hematology ROS (+)   Anesthesia Other Findings   Reproductive/Obstetrics                            Anesthesia Physical Anesthesia Plan  ASA: III  Anesthesia Plan: General   Post-op Pain Management:    Induction: Intravenous  PONV Risk Score and Plan: 2 and Ondansetron and Dexamethasone  Airway Management Planned: Oral ETT  Additional Equipment:   Intra-op Plan:   Post-operative Plan: Extubation in OR  Informed Consent: I have reviewed the patients History and Physical, chart, labs and discussed the procedure including the risks, benefits and alternatives for the proposed anesthesia with the patient or authorized representative who has indicated his/her understanding and acceptance.     Dental advisory given  Plan Discussed with: CRNA  Anesthesia Plan Comments: (Pt declines supraclavicular block, yet agrees to it as a rescue block in PACU if needs post op. Risks and benefits explained)       Anesthesia Quick Evaluation

## 2018-10-27 NOTE — Anesthesia Procedure Notes (Signed)
Procedure Name: Intubation Performed by: Gean Maidens, CRNA Pre-anesthesia Checklist: Patient identified, Emergency Drugs available, Suction available, Patient being monitored and Timeout performed Patient Re-evaluated:Patient Re-evaluated prior to induction Oxygen Delivery Method: Circle system utilized Preoxygenation: Pre-oxygenation with 100% oxygen Induction Type: IV induction Ventilation: Mask ventilation without difficulty Laryngoscope Size: Mac and 4 Grade View: Grade I Tube size: 7.5 mm Number of attempts: 1 Airway Equipment and Method: Stylet Placement Confirmation: ETT inserted through vocal cords under direct vision,  positive ETCO2 and breath sounds checked- equal and bilateral Secured at: 24 cm Tube secured with: Tape Dental Injury: Teeth and Oropharynx as per pre-operative assessment

## 2018-10-27 NOTE — Anesthesia Postprocedure Evaluation (Signed)
Anesthesia Post Note  Patient: Joseph Bullock  Procedure(s) Performed: PECTORALIS RECONSTRUCTION WITH ALLOGRAFT (Right Shoulder)     Patient location during evaluation: PACU Anesthesia Type: General Level of consciousness: oriented, awake and patient cooperative Pain management: pain level controlled Vital Signs Assessment: post-procedure vital signs reviewed and stable Respiratory status: spontaneous breathing, nonlabored ventilation and respiratory function stable Cardiovascular status: blood pressure returned to baseline and stable Postop Assessment: no apparent nausea or vomiting (nausea resolved) Anesthetic complications: no    Last Vitals:  Vitals:   10/27/18 1431 10/27/18 1445  BP: (!) 152/80 (!) 156/98  Pulse: 85 98  Resp: 15 (!) 22  Temp: 36.6 C   SpO2: 95% 96%    Last Pain:  Vitals:   10/27/18 1431  TempSrc:   PainSc: 0-No pain                 Vega Stare,E. Carlyn Mullenbach

## 2018-11-01 ENCOUNTER — Encounter (HOSPITAL_COMMUNITY): Payer: Self-pay | Admitting: Orthopedic Surgery

## 2018-11-14 ENCOUNTER — Other Ambulatory Visit: Payer: Self-pay

## 2018-11-14 ENCOUNTER — Ambulatory Visit: Payer: Commercial Managed Care - PPO | Admitting: Family Medicine

## 2018-11-14 ENCOUNTER — Encounter: Payer: Self-pay | Admitting: Family Medicine

## 2018-11-14 ENCOUNTER — Other Ambulatory Visit (HOSPITAL_COMMUNITY)
Admission: RE | Admit: 2018-11-14 | Discharge: 2018-11-14 | Disposition: A | Discharge status: Home / Self Care | Payer: Commercial Managed Care - PPO | Source: Ambulatory Visit | Attending: Family Medicine | Admitting: Family Medicine

## 2018-11-14 VITALS — BP 138/90 | HR 85 | Temp 98.5°F | Wt 271.6 lb

## 2018-11-14 DIAGNOSIS — Z113 Encounter for screening for infections with a predominantly sexual mode of transmission: Secondary | ICD-10-CM | POA: Diagnosis present

## 2018-11-14 DIAGNOSIS — N342 Other urethritis: Secondary | ICD-10-CM | POA: Diagnosis not present

## 2018-11-14 DIAGNOSIS — R300 Dysuria: Secondary | ICD-10-CM

## 2018-11-14 DIAGNOSIS — R3915 Urgency of urination: Secondary | ICD-10-CM

## 2018-11-14 MED ORDER — AZITHROMYCIN 250 MG PO TABS: ORAL_TABLET | ORAL | 0 refills | Status: DC

## 2018-11-14 MED ORDER — AZITHROMYCIN 250 MG PO TABS: 1000.0000 mg | ORAL_TABLET | Freq: Once | ORAL | 0 refills | Status: AC

## 2018-11-14 NOTE — Patient Instructions (Addendum)
I will check some labs as discussed. Start antibiotics for possible urethritis, will also check urine culture and sexually transmitted infection tests.   Return to the clinic or go to the nearest emergency room if any of your symptoms worsen or new symptoms occur.   Urethritis, Adult  Urethritis is swelling (inflammation) of the urethra. The urethra is the tube that drains urine from the bladder. It is important to get treatment for this condition early. Delayed treatment may lead to complications. What are the causes? This condition may be caused by:  Germs that are spread through sexual contact. This is the leading cause of urethritis. This may include bacterial or viral infections.  Injury to the urethra. Injury can happen after a thin, flexible tube (catheter) is inserted into the urethra to drain urine, or after medical instruments or foreign bodies are inserted into the area.  Chemical irritation. This may include contact with spermicide.  A disease that causes inflammation. This is rare. What increases the risk? The following factors may make you more likely to develop this condition:  Having sex without using a condom.  Having multiple sexual partners.  Having poor hygiene. What are the signs or symptoms? Symptoms of this condition include:  Pain with urination.  Frequent urination.  Urgent need to urinate.  Itching and pain in the vagina or penis.  Discharge coming from the penis. However, women rarely have symptoms. How is this diagnosed? This condition is diagnosed based on your medical history and symptoms as well as a physical exam. Tests may also be done. These may include:  Urine tests.  Swabs from the urethra. How is this treated? Treatment for this condition depends on the cause. Urethritis caused by a bacterial infection is treated with antibiotic medicine. Any sexual partners must also be treated. Follow these instructions at  home: Medicines  Take over-the-counter and prescription medicines only as told by your health care provider.  If you were prescribed antibiotic medicine, take it as told by your health care provider. Do not stop taking the antibiotic even if you start to feel better. Lifestyle  Avoid using perfumed soaps, bubble bath, and shampoo when you bathe or shower. Rinse the vaginal area after bathing.  Wear cotton underwear. Not wearing underwear when going to bed can help.  Make sure to wipe from front to back after using the toilet if you are male.  Do not have sex until your health care provider approves. When you do have sex, be sure to practice safe sex.  Tell anyone with whom you have had sexual relations in the past 60 days that he or she may be at risk of infection. General instructions  Drink enough fluid to keep your urine clear or pale yellow.  It is up to you to get your test results. Ask your health care provider, or the department that is doing the test, when your results will be ready.  Keep all follow-up visits as told by your health care provider. This is important.  Get tested again 3 months after treatment to make sure the infection is gone. It is important that your sexual partner also gets tested again. Contact a health care provider if:  Your symptoms have not improved after 3 days.  Your symptoms get worse.  You have eye redness or pain.  You develop abdominal pain or pelvic pain (in females).  You develop joint pain.  You have a fever. Get help right away if:  You have severe pain  in the belly, back, or side.  You vomit repeatedly. Summary  Urethritis is a swelling (inflammation) of the urethra.  This condition is caused by germs that are spread through sexual contact. This is the main cause of this illness.  It is important to get treatment for this condition early. Delayed treatment may lead to complications.  Treatment for this condition depends  on the cause. Any sexual partners must also be treated. This information is not intended to replace advice given to you by your health care provider. Make sure you discuss any questions you have with your health care provider. Document Released: 08/05/2000 Document Revised: 01/22/2017 Document Reviewed: 03/17/2016 Elsevier Patient Education  [employer].    If you have lab work done today you will be contacted with your lab results within the next 2 weeks.  If you have not heard from [place] then please contact us. The fastest way to get your results is to register for My Chart.   IF you received an x-ray today, you will receive an invoice from [facility]. Please contact [facility] at [phone] with questions or concerns regarding your invoice.   IF you received labwork today, you will receive an invoice from [facility]. Please contact [facility] at [phone] with questions or concerns regarding your invoice.   Our billing staff will not be able to assist you with questions regarding bills from these companies.  You will be contacted with the lab results as soon as they are available. The fastest way to get your results is to activate your My Chart account. Instructions are located on the last page of this paperwork. If you have not heard from [place] regarding the results in 2 weeks, please contact this office.

## 2018-11-14 NOTE — Progress Notes (Signed)
Subjective:    Patient ID: Joseph Bullock, male    DOB: Jun 24, 1985, 33 y.o.   MRN: [id number]  HPI Joseph Bullock is a 33 y.o. male Presents today for: Chief Complaint  Patient presents with  . Exposure to STD    Need to have a full panel std screening.no discharge and no abd pain. A little discomfort when urinate at time. This has been going on for 2-3 days now   Few weeks ago had UTI, treated by [facility], resolved after antibiotics - completed full course. Negative STI screening - some bacteria in urine.  Yesterday tingling with urination returned. No frequency, no penile discharge.  Monogamous, same partner, but unprotected intercourse. No abd pain, no fever, no back pain.  No external rash.  Patient Active Problem List   Diagnosis Date Noted  . Prediabetes 03/06/2016  . Prehypertension 03/06/2016  . OSA on CPAP 02/19/2014  . Microhematuria 03/09/2012  . OSA (obstructive sleep apnea) 01/07/2012  . BMI 35.0-35.9,adult 11/24/2011  . Nicotine addiction 11/24/2011   Past Medical History:  Diagnosis Date  . Sleep apnea 2016   used CPAP for 2 years- have not used CPAP in 2 years   Past Surgical History:  Procedure Laterality Date  . BICEPS TENDON REPAIR Right 10/27/2018   Procedure: PECTORALIS RECONSTRUCTION WITH ALLOGRAFT;  Surgeon: Jones Broom, MD;  Location: [facility];  Service: Orthopedics;  Laterality: Right;  REQUEST 120 MINUTES  . NASAL SEPTUM SURGERY  2018  . TONSILLECTOMY AND ADENOIDECTOMY  1994   No Known Allergies Prior to Admission medications   Not on File   Social History   Socioeconomic History  . Marital status: Divorced    Spouse name: Not on file  . Number of children: Not on file  . Years of education: Not on file  . Highest education level: Not on file  Occupational History  . Not on file  Social Needs  . Financial resource strain: Not on file  . Food insecurity    Worry: Not on file    Inability: Not on file  . Transportation needs   Medical: Not on file    Non-medical: Not on file  Tobacco Use  . Smoking status: Former Smoker    Types: Cigarettes    Quit date: 12/12/2009    Years since quitting: 8.9  . Smokeless tobacco: Never Used  Substance and Sexual Activity  . Alcohol use: Yes    Alcohol/week: 3.0 standard drinks    Types: 3 Cans of beer per week  . Drug use: No  . Sexual activity: Yes  Lifestyle  . Physical activity    Days per week: Not on file    Minutes per session: Not on file  . Stress: Not on file  Relationships  . Social Musician on phone: Not on file    Gets together: Not on file    Attends religious service: Not on file    Active member of club or organization: Not on file    Attends meetings of clubs or organizations: Not on file    Relationship status: Not on file  . Intimate partner violence    Fear of current or ex partner: Not on file    Emotionally abused: Not on file    Physically abused: Not on file    Forced sexual activity: Not on file  Other Topics Concern  . Not on file  Social History Narrative   Patient is single and lives  at home with his father.   Patient works at [employer].   Patient has a high school education.   Patient is right-handed.   Patient drinks coffee once a week.       Review of Systems Per HPi.     Objective:   Physical Exam Vitals signs reviewed.  Constitutional:      General: He is not in acute distress.    Appearance: He is well-developed.  HENT:     Head: Normocephalic and atraumatic.  Cardiovascular:     Rate and Rhythm: Normal rate.  Pulmonary:     Effort: Pulmonary effort is normal.  Abdominal:     General: Abdomen is flat.     Tenderness: There is no abdominal tenderness. There is no right CVA tenderness or left CVA tenderness.  Neurological:     Mental Status: He is alert and oriented to person, place, and time.    Vitals:   11/14/18 1535 11/14/18 1538  BP: 140/86 138/90  Pulse: 85   Temp: 98.5 F (36.9  C)   TempSrc: Oral   SpO2: 97%   Weight: 271 lb 9.6 oz (123.2 kg)         Assessment & Plan:    Joseph PalauJames T Fosco is a 33 y.o. male Dysuria - Plan: POCT urinalysis dipstick, PSA  Urinary urgency - Plan: PSA  Urethritis - Plan: HIV Antibody (routine testing w rflx), RPR, POCT urinalysis dipstick, HSV(herpes simplex vrs) 1+2 ab-IgG, azithromycin (ZITHROMAX) 250 MG tablet, DISCONTINUED: azithromycin (ZITHROMAX) 250 MG tablet, CANCELED: GC/Chlamydia Probe Amp  Routine screening for STI (sexually transmitted infection) - Plan: HIV Antibody (routine testing w rflx), RPR, HSV(herpes simplex vrs) 1+2 ab-IgG, GC/Chlamydia probe amp ([place])not at [facility], CANCELED: GC/Chlamydia Probe Amp  Early symptoms of likely urethritis.  Reportedly negative STI testing previously.  Differential includes nongonococcal urethritis.  -Azithromycin 1000 mg x 1.  STI testing as above.  - With recent infection we will also check for prostatitis with PSA, DRE deferred.  -RTC precautions.   Meds ordered this encounter  Medications  . DISCONTD: azithromycin (ZITHROMAX) 250 MG tablet    Sig: Take 2 pills by mouth on day 1, then 1 pill by mouth per day on days 2 through 5.    Dispense:  4 tablet    Refill:  0  . azithromycin (ZITHROMAX) 250 MG tablet    Sig: Take 4 tablets (1,000 mg total) by mouth once for 1 dose.    Dispense:  4 tablet    Refill:  0    Disregard prior Rx - should be this SIG.   Patient Instructions    I will check some labs as discussed. Start antibiotics for possible urethritis, will also check urine culture and sexually transmitted infection tests.   Return to the clinic or go to the nearest emergency room if any of your symptoms worsen or new symptoms occur.   Urethritis, Adult  Urethritis is swelling (inflammation) of the urethra. The urethra is the tube that drains urine from the bladder. It is important to get treatment for this condition early. Delayed treatment may lead to  complications. What are the causes? This condition may be caused by:  Germs that are spread through sexual contact. This is the leading cause of urethritis. This may include bacterial or viral infections.  Injury to the urethra. Injury can happen after a thin, flexible tube (catheter) is inserted into the urethra to drain urine, or after medical instruments or foreign bodies  are inserted into the area.  Chemical irritation. This may include contact with spermicide.  A disease that causes inflammation. This is rare. What increases the risk? The following factors may make you more likely to develop this condition:  Having sex without using a condom.  Having multiple sexual partners.  Having poor hygiene. What are the signs or symptoms? Symptoms of this condition include:  Pain with urination.  Frequent urination.  Urgent need to urinate.  Itching and pain in the vagina or penis.  Discharge coming from the penis. However, women rarely have symptoms. How is this diagnosed? This condition is diagnosed based on your medical history and symptoms as well as a physical exam. Tests may also be done. These may include:  Urine tests.  Swabs from the urethra. How is this treated? Treatment for this condition depends on the cause. Urethritis caused by a bacterial infection is treated with antibiotic medicine. Any sexual partners must also be treated. Follow these instructions at home: Medicines  Take over-the-counter and prescription medicines only as told by your health care provider.  If you were prescribed antibiotic medicine, take it as told by your health care provider. Do not stop taking the antibiotic even if you start to feel better. Lifestyle  Avoid using perfumed soaps, bubble bath, and shampoo when you bathe or shower. Rinse the vaginal area after bathing.  Wear cotton underwear. Not wearing underwear when going to bed can help.  Make sure to wipe from front to back  after using the toilet if you are male.  Do not have sex until your health care provider approves. When you do have sex, be sure to practice safe sex.  Tell anyone with whom you have had sexual relations in the past 60 days that he or she may be at risk of infection. General instructions  Drink enough fluid to keep your urine clear or pale yellow.  It is up to you to get your test results. Ask your health care provider, or the department that is doing the test, when your results will be ready.  Keep all follow-up visits as told by your health care provider. This is important.  Get tested again 3 months after treatment to make sure the infection is gone. It is important that your sexual partner also gets tested again. Contact a health care provider if:  Your symptoms have not improved after 3 days.  Your symptoms get worse.  You have eye redness or pain.  You develop abdominal pain or pelvic pain (in females).  You develop joint pain.  You have a fever. Get help right away if:  You have severe pain in the belly, back, or side.  You vomit repeatedly. Summary  Urethritis is a swelling (inflammation) of the urethra.  This condition is caused by germs that are spread through sexual contact. This is the main cause of this illness.  It is important to get treatment for this condition early. Delayed treatment may lead to complications.  Treatment for this condition depends on the cause. Any sexual partners must also be treated. This information is not intended to replace advice given to you by your health care provider. Make sure you discuss any questions you have with your health care provider. Document Released: 08/05/2000 Document Revised: 01/22/2017 Document Reviewed: 03/17/2016 Elsevier Patient Education  [employer].    If you have lab work done today you will be contacted with your lab results within the next 2 weeks.  If you  have not heard from us then please  contact us. The fastest way to get your results is to register for My Chart.   IF you received an x-ray today, you will receive an invoice from [facility]. Please contact [facility] at [phone] with questions or concerns regarding your invoice.   IF you received labwork today, you will receive an invoice from [facility]. Please contact [facility] at [phone] with questions or concerns regarding your invoice.   Our billing staff will not be able to assist you with questions regarding bills from these companies.  You will be contacted with the lab results as soon as they are available. The fastest way to get your results is to activate your My Chart account. Instructions are located on the last page of this paperwork. If you have not heard from us regarding the results in 2 weeks, please contact this office.       Signed,   Meredith StaggersJeffrey Madelein Mahadeo, MD [facility].  11/15/18 10:37 AM

## 2018-11-15 ENCOUNTER — Other Ambulatory Visit: Payer: Self-pay | Admitting: Family Medicine

## 2018-11-15 LAB — RPR: RPR Ser Ql: NONREACTIVE

## 2018-11-15 LAB — HIV ANTIBODY (ROUTINE TESTING W REFLEX): HIV Screen 4th Generation wRfx: NONREACTIVE

## 2018-11-15 LAB — HSV(HERPES SIMPLEX VRS) I + II AB-IGG
HSV 1 Glycoprotein G Ab, IgG: <0.91 index (ref 0.00–0.90)
HSV 2 IgG, Type Spec: <0.91 index (ref 0.00–0.90)

## 2018-11-15 LAB — PSA: Prostate Specific Ag, Serum: 1.6 ng/mL (ref 0.0–4.0)

## 2018-11-17 LAB — URINE CYTOLOGY ANCILLARY ONLY
Chlamydia: NEGATIVE
Neisseria Gonorrhea: NEGATIVE

## 2019-01-18 ENCOUNTER — Encounter: Payer: Self-pay | Admitting: Family Medicine

## 2019-01-18 ENCOUNTER — Other Ambulatory Visit: Payer: Self-pay

## 2019-01-18 ENCOUNTER — Ambulatory Visit: Payer: Commercial Managed Care - PPO | Admitting: Family Medicine

## 2019-01-18 VITALS — BP 139/92 | HR 128 | Temp 99.0°F | Resp 17 | Ht 72.0 in | Wt 284.6 lb

## 2019-01-18 DIAGNOSIS — G4733 Obstructive sleep apnea (adult) (pediatric): Secondary | ICD-10-CM | POA: Diagnosis not present

## 2019-01-18 NOTE — Progress Notes (Signed)
Established Patient Office Visit  Subjective:  Patient ID: Joseph Bullock, male    DOB: Feb 04, 1986  Age: 33 y.o. MRN: [id number]  CC:  Chief Complaint  Patient presents with  . Referral    sleep study    HPI Joseph Bullock presents for   Patient had work exam and was advised to get a repeat study before returning to work He is a Administrator (locally)  Patient reports that is exercise  He was using a cpap in the past He had surgery for his septum He has not had any re-evaluation after surgery  Wt Readings from Last 3 Encounters:  01/18/19 284 lb 9.6 oz (129.1 kg)  11/14/18 271 lb 9.6 oz (123.2 kg)  10/27/18 270 lb (122.5 kg)     Past Medical History:  Diagnosis Date  . Sleep apnea 2016   used CPAP for 2 years- have not used CPAP in 2 years    Past Surgical History:  Procedure Laterality Date  . BICEPS TENDON REPAIR Right 10/27/2018   Procedure: PECTORALIS RECONSTRUCTION WITH ALLOGRAFT;  Surgeon: Tania Ade, MD;  Location: [facility];  Service: Orthopedics;  Laterality: Right;  REQUEST 120 MINUTES  . NASAL SEPTUM SURGERY  2018  . TONSILLECTOMY AND ADENOIDECTOMY  1994    No family history on file.  Social History   Socioeconomic History  . Marital status: Divorced    Spouse name: Not on file  . Number of children: Not on file  . Years of education: Not on file  . Highest education level: Not on file  Occupational History  . Not on file  Social Needs  . Financial resource strain: Not on file  . Food insecurity    Worry: Not on file    Inability: Not on file  . Transportation needs    Medical: Not on file    Non-medical: Not on file  Tobacco Use  . Smoking status: Former Smoker    Types: Cigarettes    Quit date: 12/12/2009    Years since quitting: 9.1  . Smokeless tobacco: Never Used  Substance and Sexual Activity  . Alcohol use: Yes    Alcohol/week: 3.0 standard drinks    Types: 3 Cans of beer per week  . Drug use: No  . Sexual activity: Yes   Lifestyle  . Physical activity    Days per week: Not on file    Minutes per session: Not on file  . Stress: Not on file  Relationships  . Social Herbalist on phone: Not on file    Gets together: Not on file    Attends religious service: Not on file    Active member of club or organization: Not on file    Attends meetings of clubs or organizations: Not on file    Relationship status: Not on file  . Intimate partner violence    Fear of current or ex partner: Not on file    Emotionally abused: Not on file    Physically abused: Not on file    Forced sexual activity: Not on file  Other Topics Concern  . Not on file  Social History Narrative   Patient is single and lives at home with his father.   Patient works at [employer].   Patient has a high school education.   Patient is right-handed.   Patient drinks coffee once a week.       No outpatient medications prior to visit.  No facility-administered medications prior to visit.     No Known Allergies  ROS Review of Systems Review of Systems  Constitutional: Negative for activity change, appetite change, chills and fever.  HENT: Negative for congestion, nosebleeds, trouble swallowing and voice change.   Respiratory: Negative for cough, shortness of breath and wheezing.   Gastrointestinal: Negative for diarrhea, nausea and vomiting.  Genitourinary: Negative for difficulty urinating, dysuria, flank pain and hematuria.  Musculoskeletal: Negative for back pain, joint swelling and neck pain.  Neurological: Negative for dizziness, speech difficulty, light-headedness and numbness.  See HPI. All other review of systems negative.     Objective:    Physical Exam  BP (!) 139/92 (BP Location: Right Arm, Patient Position: Sitting, Cuff Size: Large)   Pulse (!) 128   Temp 99 F (37.2 C) (Oral)   Resp 17   Ht 6' (1.829 m)   Wt 284 lb 9.6 oz (129.1 kg)   SpO2 99%   BMI 38.60 kg/m  Wt Readings from Last 3  Encounters:  01/18/19 284 lb 9.6 oz (129.1 kg)  11/14/18 271 lb 9.6 oz (123.2 kg)  10/27/18 270 lb (122.5 kg)   Physical Exam  Constitutional: Oriented to person, place, and time. Appears well-developed and well-nourished.  HENT:  Head: Normocephalic and atraumatic.  Eyes: Conjunctivae and EOM are normal.  Cardiovascular: Normal rate, regular rhythm, normal heart sounds and intact distal pulses.  No murmur heard. Pulmonary/Chest: Effort normal and breath sounds normal. No stridor. No respiratory distress. Has no wheezes.  Neurological: Is alert and oriented to person, place, and time.  Skin: Skin is warm. Capillary refill takes less than 2 seconds.  Psychiatric: Has a normal mood and affect. Behavior is normal. Judgment and thought content normal.    Health Maintenance Due  Topic Date Due  . INFLUENZA VACCINE  09/24/2018    There are no preventive care reminders to display for this patient.  No results found for: TSH Lab Results  Component Value Date   WBC 8.7 10/24/2018   HGB 15.7 10/24/2018   HCT 48.3 10/24/2018   MCV 88.0 10/24/2018   PLT 243 10/24/2018   Lab Results  Component Value Date   NA 138 10/24/2018   K 3.8 10/24/2018   CO2 26 10/24/2018   GLUCOSE 166 (H) 10/24/2018   BUN 18 10/24/2018   CREATININE 1.38 (H) 10/24/2018   BILITOT 0.9 10/24/2018   ALKPHOS 54 10/24/2018   AST 30 10/24/2018   ALT 42 10/24/2018   PROT 7.9 10/24/2018   ALBUMIN 4.4 10/24/2018   CALCIUM 9.4 10/24/2018   ANIONGAP 10 10/24/2018   Lab Results  Component Value Date   HGBA1C 5.9 (H) 10/24/2018      Assessment & Plan:   Problem List Items Addressed This Visit      Respiratory   OSA (obstructive sleep apnea) - Primary   Relevant Orders   Ambulatory referral to Sleep Studies    referral placed.  Advised to continue to monitor his bp which is slightly elevated today Return to clinic as needed Discussed that obesity is another cause of OSA and although the deviated septum  has been repaired he could still have OSA from obesity. Will have patient follow up with virtual visit for weight check and will serve as patient accountability buddy to offer support    No orders of the defined types were placed in this encounter.   Follow-up: No follow-ups on file.    Doristine Bosworth, MD

## 2019-01-18 NOTE — Patient Instructions (Signed)
° ° ° °  If you have lab work done today you will be contacted with your lab results within the next 2 weeks.  If you have not heard from us then please contact us. The fastest way to get your results is to register for My Chart. ° ° °IF you received an x-ray today, you will receive an invoice from [facility]. Please contact [facility] at [phone] with questions or concerns regarding your invoice.  ° °IF you received labwork today, you will receive an invoice from [facility]. Please contact [facility] at [phone] with questions or concerns regarding your invoice.  ° °Our billing staff will not be able to assist you with questions regarding bills from these companies. ° °You will be contacted with the lab results as soon as they are available. The fastest way to get your results is to activate your My Chart account. Instructions are located on the last page of this paperwork. If you have not heard from us regarding the results in 2 weeks, please contact this office. °  ° ° ° °

## 2019-01-31 ENCOUNTER — Ambulatory Visit: Payer: Commercial Managed Care - PPO | Admitting: Pulmonary Disease

## 2019-01-31 ENCOUNTER — Encounter: Payer: Self-pay | Admitting: Pulmonary Disease

## 2019-01-31 ENCOUNTER — Institutional Professional Consult (permissible substitution): Payer: Commercial Managed Care - PPO | Admitting: Pulmonary Disease

## 2019-01-31 ENCOUNTER — Other Ambulatory Visit: Payer: Self-pay

## 2019-01-31 DIAGNOSIS — G4726 Circadian rhythm sleep disorder, shift work type: Secondary | ICD-10-CM | POA: Diagnosis not present

## 2019-01-31 DIAGNOSIS — G4733 Obstructive sleep apnea (adult) (pediatric): Secondary | ICD-10-CM

## 2019-01-31 NOTE — Assessment & Plan Note (Signed)
He had severe OSA when his weight was 260 pounds.  He is now up to 280 pounds, he had deviated nasal septum surgery in the interim.  He does not appear to be sleepy based on Epworth score We will reschedule home sleep study, he will likely need CPAP machine and if so we will start him with auto CPAP with nasal pillows or nasal mask before clearing him  Given excessive daytime somnolence, narrow pharyngeal exam, witnessed apneas & loud snoring, obstructive sleep apnea is very likely & an overnight polysomnogram will be scheduled as a home study. The pathophysiology of obstructive sleep apnea , it's cardiovascular consequences & modes of treatment including CPAP were discused with the patient in detail & they evidenced understanding.

## 2019-01-31 NOTE — Progress Notes (Signed)
Subjective:    Patient ID: Joseph Bullock, male    DOB: 05-12-1985, 33 y.o.   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  He works for [employer] 4 AM shifts 5 days a week.  He was diagnosed with severe OSA in 2013 and use nasal CPAP for a few months.  He then had surgery for deviated nasal septum and felt that his symptoms were dramatically improved and stopped using his machine.  He never had a follow-up sleep study. He underwent biceps tendon surgery few months ago and his employer requested repeat sleep study before return to work We will have reviewed his previous sleep study.  Epworth sleepiness score is 2 and he denies sleepiness while driving or at work. Bedtime is around 5 AM, sleep latency is minimal, he sleeps on his left side with 1 pillow, reports 1-2 awakenings without any post void sleep latency and is out of bed around noontime feeling rested without dryness of mouth or headaches.  On his days off he will keep the same sleep schedule.  No sleep partner history is available but he reports that no snoring has been noted  There is no history suggestive of cataplexy, sleep paralysis or parasomnias  He is noted to be tachycardic today and he states that this happens in a doctor's office.  He denies illicit drug use.  He quit smoking in 2011 and drinks beer 2-3 times a week  Significant tests/ events reviewed  01/2012 split study -260 pounds-AHI 32/hour, RDI 55/hour, lowest desaturation 88% corrected by CPAP 6 cm nasal mask  Past Medical History:  Diagnosis Date  . Sleep apnea 2016   used CPAP for 2 years- have not used CPAP in 2 years    Past Surgical History:  Procedure Laterality Date  . BICEPS TENDON REPAIR Right 10/27/2018   Procedure: PECTORALIS RECONSTRUCTION WITH ALLOGRAFT;  Surgeon: Jones Broom, MD;  Location: [facility];  Service:  Orthopedics;  Laterality: Right;  REQUEST 120 MINUTES  . NASAL SEPTUM SURGERY  2018  . TONSILLECTOMY AND ADENOIDECTOMY  1994    No Known Allergies  Social History   Socioeconomic History  . Marital status: Divorced    Spouse name: Not on file  . Number of children: Not on file  . Years of education: Not on file  . Highest education level: Not on file  Occupational History  . Not on file  Social Needs  . Financial resource strain: Not on file  . Food insecurity    Worry: Not on file    Inability: Not on file  . Transportation needs    Medical: Not on file    Non-medical: Not on file  Tobacco Use  . Smoking status: Former Smoker    Types: Cigarettes    Quit date: 12/12/2009    Years since quitting: 9.1  . Smokeless tobacco: Never Used  Substance and Sexual Activity  . Alcohol use: Yes    Alcohol/week: 3.0 standard drinks    Types: 3 Cans of beer per week  . Drug use: No  . Sexual activity: Yes  Lifestyle  . Physical activity    Days per week: Not on file    Minutes per session: Not on file  . Stress: Not on file  Relationships  . Social connections    Talks on phone: Not on file  Gets together: Not on file    Attends religious service: Not on file    Active member of club or organization: Not on file    Attends meetings of clubs or organizations: Not on file    Relationship status: Not on file  . Intimate partner violence    Fear of current or ex partner: Not on file    Emotionally abused: Not on file    Physically abused: Not on file    Forced sexual activity: Not on file  Other Topics Concern  . Not on file  Social History Narrative   Patient is single and lives at home with his father.   Patient works at [employer].   Patient has a high school education.   Patient is right-handed.   Patient drinks coffee once a week.         History reviewed. No pertinent family history.     Review of Systems Constitutional: negative for anorexia,  fevers and sweats  Eyes: negative for irritation, redness and visual disturbance  Ears, nose, mouth, throat, and face: negative for earaches, epistaxis, nasal congestion and sore throat  Respiratory: negative for cough, dyspnea on exertion, sputum and wheezing  Cardiovascular: negative for chest pain, dyspnea, lower extremity edema, orthopnea, palpitations and syncope  Gastrointestinal: negative for abdominal pain, constipation, diarrhea, melena, nausea and vomiting  Genitourinary:negative for dysuria, frequency and hematuria  Hematologic/lymphatic: negative for bleeding, easy bruising and lymphadenopathy  Musculoskeletal:negative for arthralgias, muscle weakness and stiff joints  Neurological: negative for coordination problems, gait problems, headaches and weakness  Endocrine: negative for diabetic symptoms including polydipsia, polyuria and weight loss     Objective:   Physical Exam  Gen. Pleasant, obese, in no distress, normal affect ENT - no pallor,icterus, no post nasal drip, class 2-3 airway, deviated septum to right Neck: No JVD, no thyromegaly, no carotid bruits Lungs: no use of accessory muscles, no dullness to percussion, decreased without rales or rhonchi  Cardiovascular: Rhythm regular, heart sounds  normal, no murmurs or gallops, no peripheral edema Abdomen: soft and non-tender, no hepatosplenomegaly, BS normal. Musculoskeletal: No deformities, no cyanosis or clubbing Neuro:  alert, non focal, no tremors       Assessment & Plan:

## 2019-01-31 NOTE — Assessment & Plan Note (Signed)
He seems to have been working shifts for 9 years and is very well adjusted

## 2019-01-31 NOTE — Patient Instructions (Signed)
Schedule home sleep test.  Based on this we will set you up with a CPAP machine if needed

## 2019-02-15 ENCOUNTER — Telehealth: Payer: Self-pay | Admitting: Pulmonary Disease

## 2019-02-15 NOTE — Telephone Encounter (Signed)
Message forwarded to [facility] pool for recommendations before composing letter for doctor signature.  Ladies, can you give suggestion as to how we can word this letter. I know the schedule is backed up due to covid, but are there any other suggestions regarding input for this letter? Please advise, thank you much.

## 2019-02-16 NOTE — Telephone Encounter (Signed)
Spoke with patient. He is aware that I will compose the letter today. He just needs it to state that our office is limiting the amount of home sleep studies due to the increase in [place] numbers. He wishes to have this letter faxed to [facility] at [phone].

## 2019-02-16 NOTE — Telephone Encounter (Signed)
Due to Covid we are backed up getting the studies done

## 2019-03-02 ENCOUNTER — Telehealth: Payer: Self-pay | Admitting: Pulmonary Disease

## 2019-03-02 NOTE — Telephone Encounter (Signed)
Spoke to pt made him aware of our back log since he needs this for work we will call him back on Monday and get him set up next week to do his study Joseph Bullock

## 2019-03-09 ENCOUNTER — Other Ambulatory Visit: Payer: Self-pay

## 2019-03-09 ENCOUNTER — Ambulatory Visit: Payer: Commercial Managed Care - PPO

## 2019-03-09 DIAGNOSIS — G4726 Circadian rhythm sleep disorder, shift work type: Secondary | ICD-10-CM

## 2019-03-09 DIAGNOSIS — G4733 Obstructive sleep apnea (adult) (pediatric): Secondary | ICD-10-CM

## 2019-03-10 DIAGNOSIS — G4733 Obstructive sleep apnea (adult) (pediatric): Secondary | ICD-10-CM

## 2019-03-14 ENCOUNTER — Telehealth: Payer: Self-pay | Admitting: Pulmonary Disease

## 2019-03-14 DIAGNOSIS — G4733 Obstructive sleep apnea (adult) (pediatric): Secondary | ICD-10-CM

## 2019-03-14 NOTE — Telephone Encounter (Signed)
Spoke with pt, advised him that the HST can take up to two weeks to come back. He would like to ask if this could be expedited because he cannot work until his has the results. Please advise if this could be done for him.

## 2019-03-14 NOTE — Telephone Encounter (Signed)
Dr. Vassie Loll. Patient is requesting a rush on his sleep study results so he will not lose his benefits for work.

## 2019-03-14 NOTE — Telephone Encounter (Signed)
This is on Dr. Reginia Naas desk with a note stating the patient needed the results to return to work

## 2019-03-14 NOTE — Telephone Encounter (Signed)
Pt called back to see if we could put a rush on things as he may lose his benefits if he can't get back to work.

## 2019-03-15 DIAGNOSIS — G4733 Obstructive sleep apnea (adult) (pediatric): Secondary | ICD-10-CM | POA: Diagnosis not present

## 2019-03-15 NOTE — Telephone Encounter (Signed)
Spoke with pt. He is aware of his results. Order has been placed for CPAP. Advised pt that he would need to follow up 31-90 days after getting CPAP. Nothing further was needed.

## 2019-03-15 NOTE — Telephone Encounter (Signed)
Please let him know that he has severe OSA on his sleep study from 1/15, AHI 91/hour Recommend auto CPAP 5 to 20 cm-his previous DME was [facility], nasal pillows or nasal mask.  He would need to use his machine for at least 2 weeks before we/ his PCP can clear him

## 2019-03-17 ENCOUNTER — Telehealth: Payer: Self-pay | Admitting: Pulmonary Disease

## 2019-03-17 NOTE — Telephone Encounter (Signed)
Order not on my queue to sign OK to send to [facility]

## 2019-03-17 NOTE — Telephone Encounter (Signed)
Found HST, scanned it & sent order to [facility].

## 2019-03-17 NOTE — Telephone Encounter (Signed)
PCC's is this order waiting for signature from RA?

## 2019-03-17 NOTE — Telephone Encounter (Signed)
  Spoke with pt, I advised him that it may take a couple of weeks for CPAP setup. He wanted to see if we could send his order to [facility]. PCC's please advise.  RA please sign order.     Referral Notes Number of Notes: 3 . Type Date User Summary Attachment  General 03/16/2019 7:50 AM Lanna Poche D - -  Note   Waiting for HST to be scanned         . Type Date User Summary Attachment  General 03/15/2019 2:35 PM Lanna Poche D - -  Note   Holding for signature & for HST to be scanned.          . Type Date User Summary Attachment  Provider Comments 03/15/2019 2:30 PM Lorel Monaco, CMA Provider Comments -  Note   Patient has OSA or probable OSA (Yes or No): Yes Is the patient currently using CPAP within the home? (Yes or No): No If no to question 2, date of sleep study: 03/09/2019 Date of face-to-face encounter: 01/31/2019 Settings: Auto 5-20cm Signs and symptoms or probable OSA, mention all that apply (snoring) (morning headaches) (witnessed apneas) (choking) (gasping during sleep): snoring Resmed S/O *or* DreamStation air/auto with heated humidity.   Enroll in [employer] Provide patient with SD card when [employer] enrollment expires CPAP supplies needed: mask of choice, headgear, cushions, filters, climate control tubing and water chamber. Length of Need: Lifetime  PT WILL NEED APPOINTMENT IN OFFICE WITH DME FOR CPAP SET UP.

## 2019-03-17 NOTE — Telephone Encounter (Addendum)
No, the order has been signed.  I am looking for the HST & once that is located, I will be able to send the order to pt's preference, [facility].

## 2019-03-17 NOTE — Telephone Encounter (Signed)
Ok thanks [place].

## 2019-03-21 ENCOUNTER — Telehealth (INDEPENDENT_AMBULATORY_CARE_PROVIDER_SITE_OTHER): Payer: Commercial Managed Care - PPO | Admitting: Family Medicine

## 2019-03-21 ENCOUNTER — Other Ambulatory Visit: Payer: Self-pay

## 2019-03-21 NOTE — Progress Notes (Signed)
Telemedicine Encounter- SOAP NOTE Established Patient  This telephone encounter was conducted with the patient's (or proxy's) verbal consent via audio telecommunications: yes/no: Yes Patient was instructed to have this encounter in a suitably private space; and to only have persons present to whom they give permission to participate. In addition, patient identity was confirmed by use of name plus two identifiers (DOB and address).  I discussed the limitations, risks, security and privacy concerns of performing an evaluation and management service by telephone and the availability of in person appointments. I also discussed with the patient that there may be a patient responsible charge related to this service. The patient expressed understanding and agreed to proceed.  I spent a total of TIME; 0 MIN TO 60 MIN: 15 minutes talking with the patient or their proxy.  CC: WEIGHT LOSS  Subjective   Joseph Bullock is a 34 y.o. established patient. Telephone visit today for  HPI  He states that he is back in the gym His weight is improving He is working out by running and lifting weights During the week he meal preps but on the weekends he is eating whatever  Wt Readings from Last 3 Encounters:  03/21/19 276 lb (125.2 kg)  01/31/19 283 lb (128.4 kg)  01/18/19 284 lb 9.6 oz (129.1 kg)    Patient Active Problem List   Diagnosis Date Noted  . 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 Prediabetes 03/06/2016  . Prehypertension 03/06/2016  . OSA on CPAP 02/19/2014  . Microhematuria 03/09/2012  . OSA (obstructive sleep apnea) 01/07/2012  . BMI 35.0-35.9,adult 11/24/2011  . Nicotine addiction 11/24/2011    Past Medical History:  Diagnosis Date  . Sleep apnea 2016   used CPAP for 2 years- have not used CPAP in 2 years    No current outpatient medications on file.   No current facility-administered medications for this visit.    No Known Allergies  Social  History   Socioeconomic History  . Marital status: Divorced    Spouse name: Not on file  . Number of children: Not on file  . Years of education: Not on file  . Highest education level: Not on file  Occupational History  . Not on file  Tobacco Use  . Smoking status: Former Smoker    Types: Cigarettes    Quit date: 12/12/2009    Years since quitting: 9.2  . Smokeless tobacco: Never Used  Substance and Sexual Activity  . Alcohol use: Yes    Alcohol/week: 3.0 standard drinks    Types: 3 Cans of beer per week  . Drug use: No  . Sexual activity: Yes  Other Topics Concern  . Not on file  Social History Narrative   Patient is single and lives at home with his father.   Patient works at [employer].   Patient has a high school education.   Patient is right-handed.   Patient drinks coffee once a week.      Social Determinants of Health   Financial Resource Strain:   . Difficulty of Paying Living Expenses: Not on file  Food Insecurity:   . Worried About Charity fundraiser in the Last Year: Not on file  . Ran Out of Food in the Last Year: Not on file  Transportation Needs:   . Lack of Transportation (Medical): Not on file  . Lack of Transportation (Non-Medical): Not on file  Physical Activity:   . Days of Exercise  per Week: Not on file  . Minutes of Exercise per Session: Not on file  Stress:   . Feeling of Stress : Not on file  Social Connections:   . Frequency of Communication with Friends and Family: Not on file  . Frequency of Social Gatherings with Friends and Family: Not on file  . Attends Religious Services: Not on file  . Active Member of Clubs or Organizations: Not on file  . Attends Banker Meetings: Not on file  . Marital Status: Not on file  Intimate Partner Violence:   . Fear of Current or Ex-Partner: Not on file  . Emotionally Abused: Not on file  . Physically Abused: Not on file  . Sexually Abused: Not on file    ROS Review of Systems   Constitutional: Negative for activity change, appetite change, chills and fever.  HENT: Negative for congestion, nosebleeds, trouble swallowing and voice change.   Respiratory: Negative for cough, shortness of breath and wheezing.   Gastrointestinal: Negative for diarrhea, nausea and vomiting.  Genitourinary: Negative for difficulty urinating, dysuria, flank pain and hematuria.  Musculoskeletal: Negative for back pain, joint swelling and neck pain.  Neurological: Negative for dizziness, speech difficulty, light-headedness and numbness.  See HPI. All other review of systems negative.   Objective   Vitals as reported by the patient: Today's Vitals   03/21/19 1206  Weight: 276 lb (125.2 kg)    Diagnoses and all orders for this visit:  Morbid obesity (HCC)   Advised pt to cut back on the cheat day to a cheat meal Doing well with diet and exercise Continue balanced eating plan   I discussed the assessment and treatment plan with the patient. The patient was provided an opportunity to ask questions and all were answered. The patient agreed with the plan and demonstrated an understanding of the instructions.   The patient was advised to call back or seek an in-person evaluation if the symptoms worsen or if the condition fails to improve as anticipated.  I provided 15 minutes of non-face-to-face time during this encounter.  Doristine Bosworth, MD  Primary Care at [facility]

## 2019-03-21 NOTE — Progress Notes (Signed)
Two month follow up on weight loss, pt says his last weigh in was about 2 wks ago weighing 274lb. Last encounter visit weigh in was 284lb. He is having no medical concerns at this time. Taking no meds. No anxiety, falls or depression.

## 2019-06-01 ENCOUNTER — Ambulatory Visit: Payer: Commercial Managed Care - PPO

## 2019-06-01 ENCOUNTER — Ambulatory Visit: Payer: Self-pay | Attending: Internal Medicine

## 2019-06-01 DIAGNOSIS — Z23 Encounter for immunization: Secondary | ICD-10-CM

## 2019-06-01 NOTE — Progress Notes (Signed)
   Covid-19 Vaccination Clinic  Name:  JONN CHAIKIN    MRN: [id number] DOB: [birth year]  06/01/2019  Mr. Leitzel was observed post Covid-19 immunization for 15 minutes without incident. He was provided with Vaccine Information Sheet and instruction to access the V-Safe system.   Mr. Bodey was instructed to call 911 with any severe reactions post vaccine: Marland Kitchen Difficulty breathing  . Swelling of face and throat  . A fast heartbeat  . A bad rash all over body  . Dizziness and weakness   Immunizations Administered    Name Date Dose VIS Date Route   Pfizer COVID-19 Vaccine 06/01/2019  9:35 AM 0.3 mL 02/03/2019 Intramuscular   Manufacturer: [employer], [employer]   Lot: [id number]   NDC: [address]-9

## 2019-07-03 ENCOUNTER — Ambulatory Visit: Payer: Self-pay | Attending: Internal Medicine

## 2019-07-03 DIAGNOSIS — Z23 Encounter for immunization: Secondary | ICD-10-CM

## 2019-07-03 NOTE — Progress Notes (Signed)
   Covid-19 Vaccination Clinic  Name:  DOW BLAHNIK    MRN: [id number] DOB: 27-Nov-1985  07/03/2019  Mr. Missildine was observed post Covid-19 immunization for 15 minutes without incident. He was provided with Vaccine Information Sheet and instruction to access the V-Safe system.   Mr. Heinlen was instructed to call 911 with any severe reactions post vaccine: Marland Kitchen Difficulty breathing  . Swelling of face and throat  . A fast heartbeat  . A bad rash all over body  . Dizziness and weakness   Immunizations Administered    Name Date Dose VIS Date Route   Pfizer COVID-19 Vaccine 07/03/2019  8:09 AM 0.3 mL 04/19/2018 Intramuscular   Manufacturer: [employer], [employer]   Lot: [id number]   NDC: [address]-0

## 2019-08-01 IMAGING — US ULTRASOUND RIGHT BREAST LIMITED
1 series · 8 of 8 positions shown · non-contrast
Comparison: None

ACR Breast Density Category a: The breast tissue is almost entirely
fatty.

CLINICAL DATA: Trauma to the upper-outer right chest 2 months ago
with lumpiness and pain.

EXAM:
DIGITAL DIAGNOSTIC BILATERAL MAMMOGRAM WITH CAD AND TOMO
ULTRASOUND RIGHT BREAST

[Series 1: ultrasound right breast limited · 0.06mm/px · 8 of 8 slices shown]
[im 1/8]
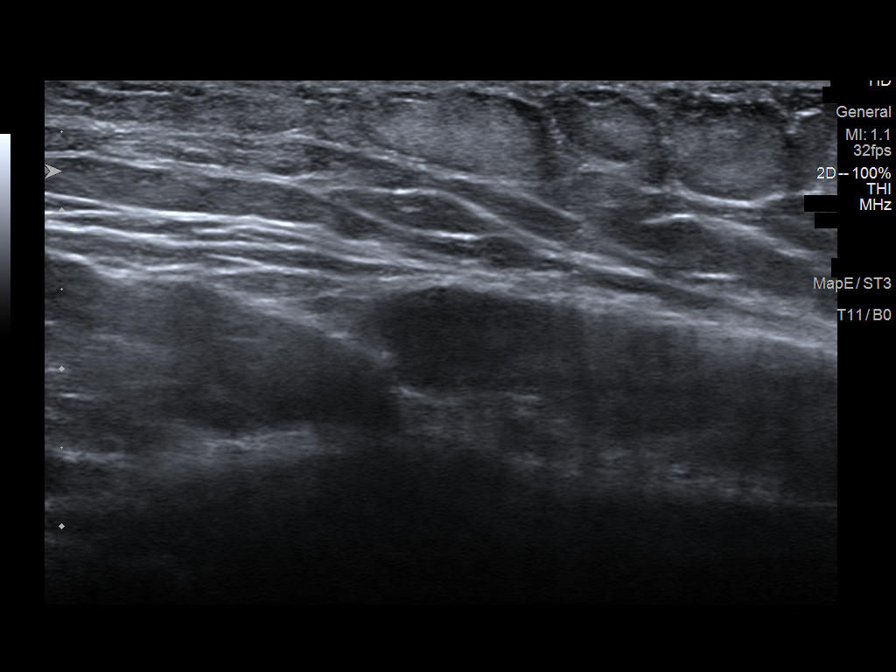
[im 2/8]
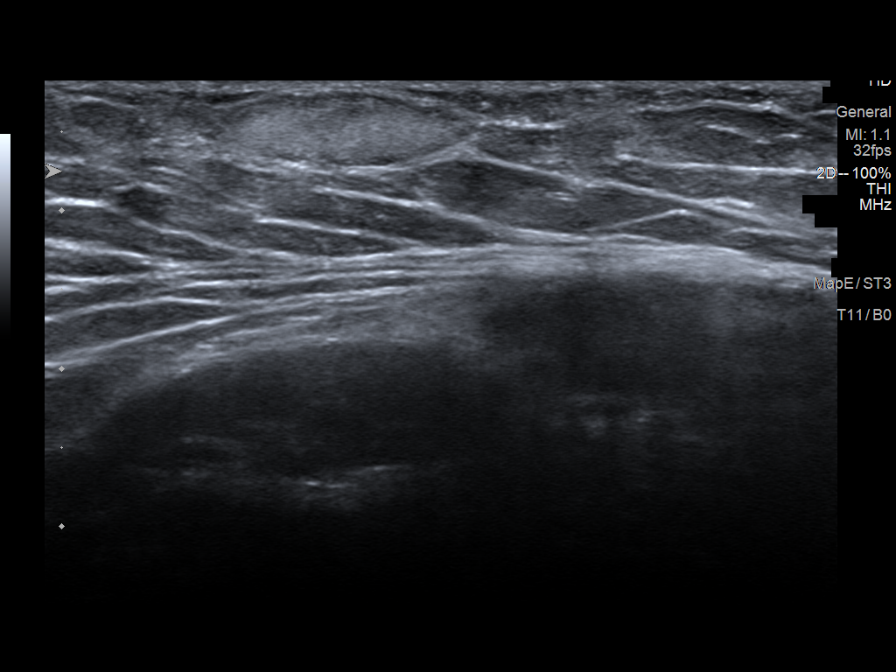
[im 3/8]
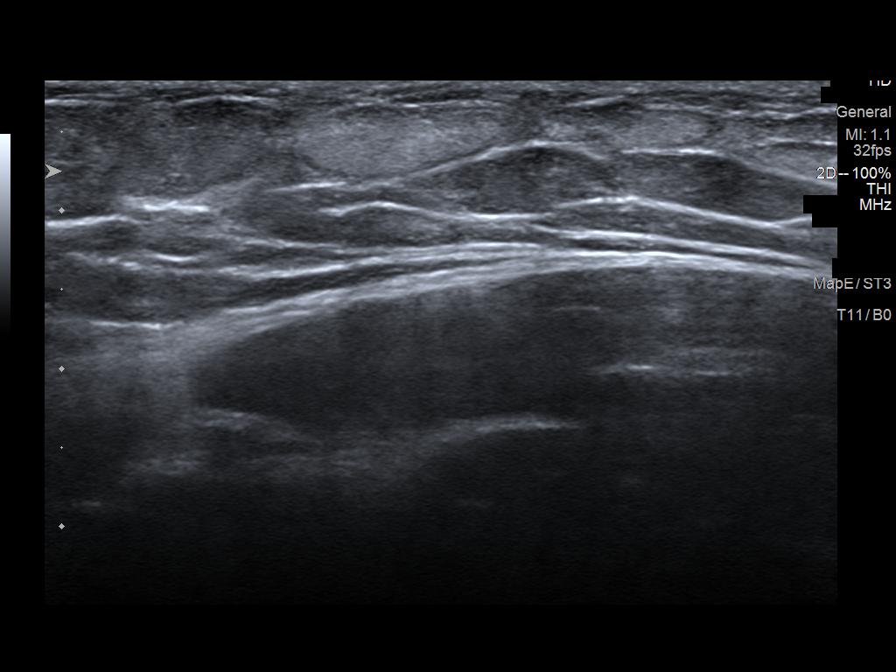
[im 4/8]
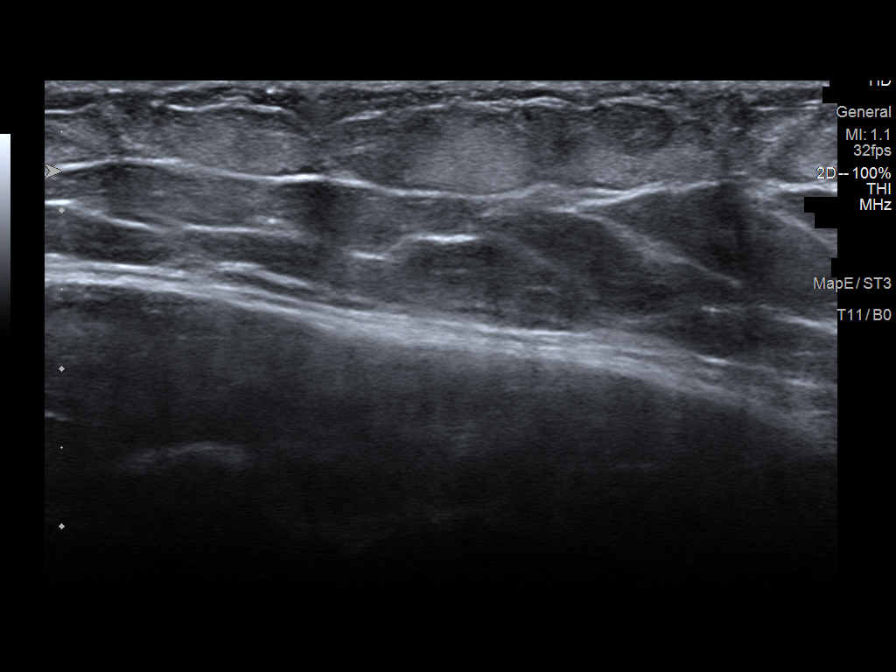
[im 5/8]
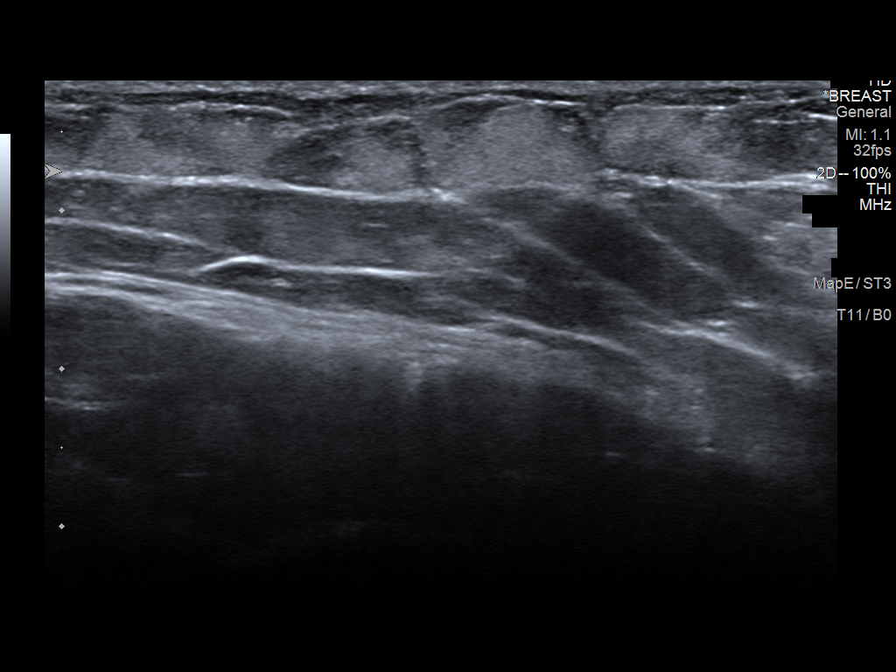
[im 6/8]
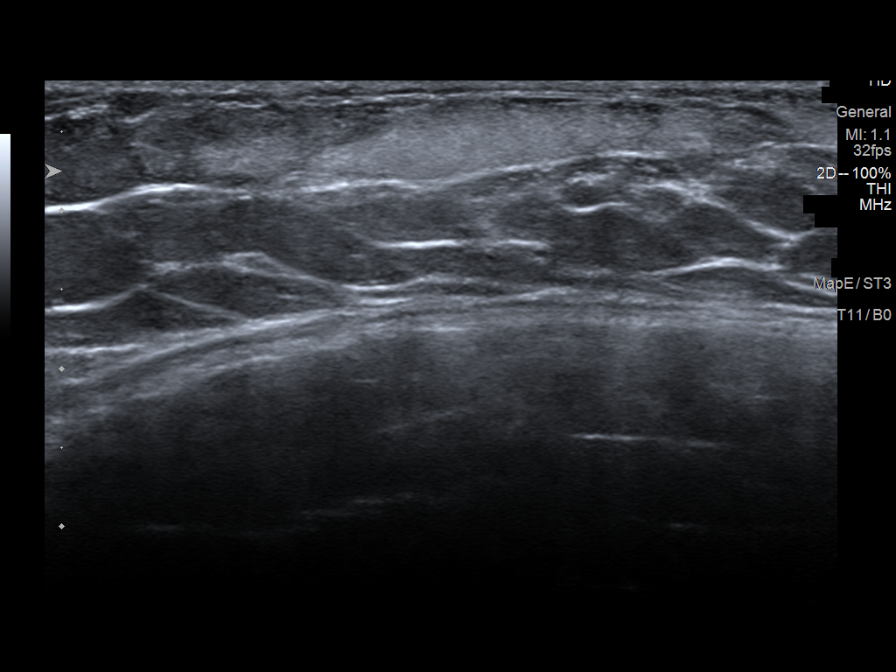
[im 7/8]
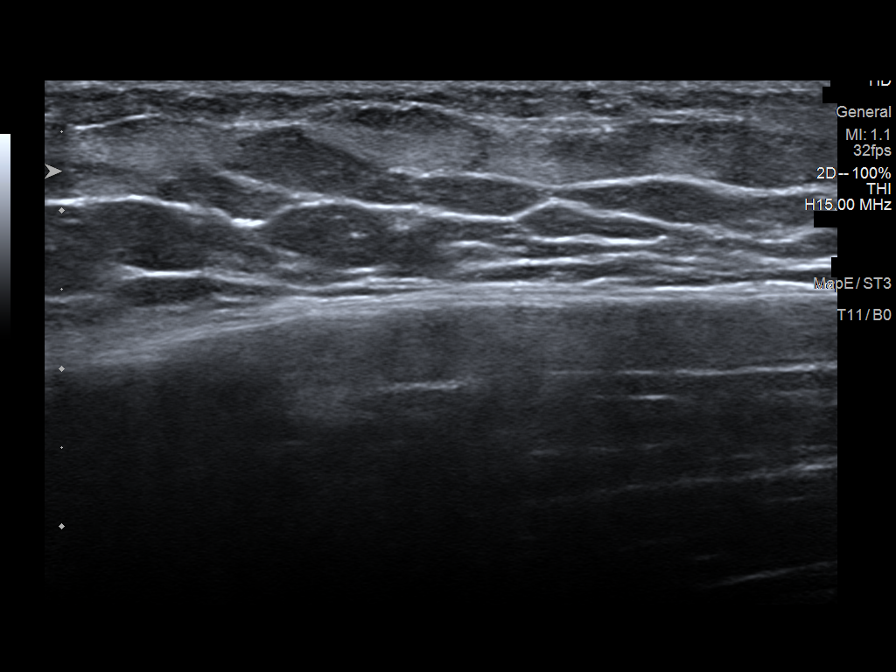
[im 8/8]
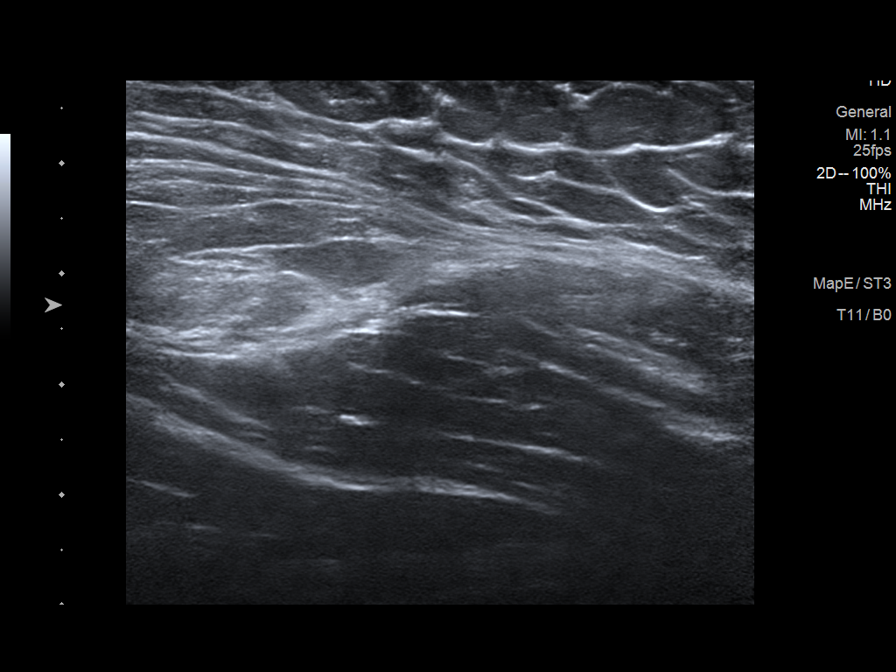

[8 of 8 positions shown; findings below may reference images not displayed]

FINDINGS: There is a contour abnormality in the superolateral pectoralis
muscle seen on the MLO and CC views. No other suspicious
mammographic findings.

Mammographic images were processed with CAD.

On physical exam, some lumpiness is pointed out by the patient.

Targeted ultrasound is performed, showing mild increased
echogenicity in the subcutaneous fat likely mild fat necrosis. No
focal mass.
IMPRESSION: Contour abnormality in the superolateral right pectoralis muscle.
The findings are consistent with muscular injury, perhaps a
significant tear. Recommend MRI of the chest for better evaluation.

RECOMMENDATION:
Chest MRI for further evaluation of the right pectoralis muscle.

I have discussed the findings and recommendations with the patient.
Results were also provided in writing at the conclusion of the
visit. If applicable, a reminder letter will be sent to the patient
regarding the next appointment.

BI-RADS CATEGORY  2: Benign.

## 2019-08-01 IMAGING — MG DIGITAL DIAGNOSTIC BILATERAL MAMMOGRAM WITH TOMO AND CAD
6 of 10 series · 6 of 30 positions shown · non-contrast
Comparison: None

ACR Breast Density Category a: The breast tissue is almost entirely
fatty.

CLINICAL DATA: Trauma to the upper-outer right chest 2 months ago
with lumpiness and pain.

EXAM:
DIGITAL DIAGNOSTIC BILATERAL MAMMOGRAM WITH CAD AND TOMO
ULTRASOUND RIGHT BREAST

[L MLO synth-2D]
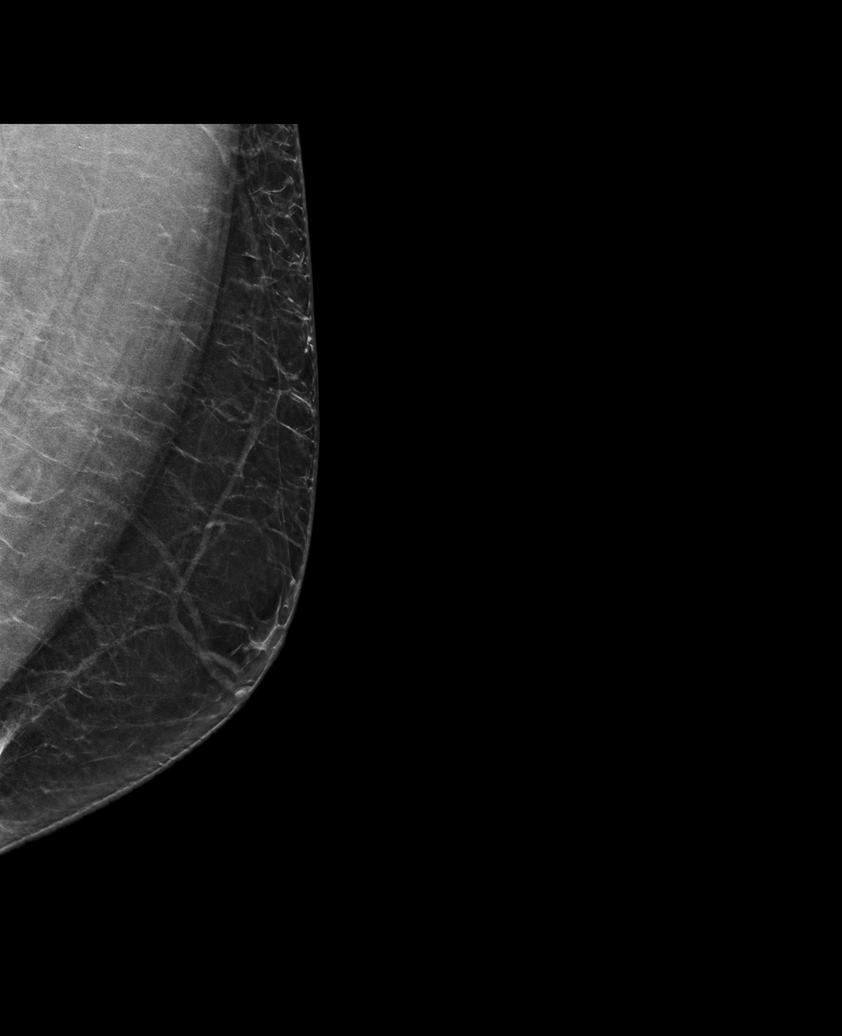

[L CC synth-2D]
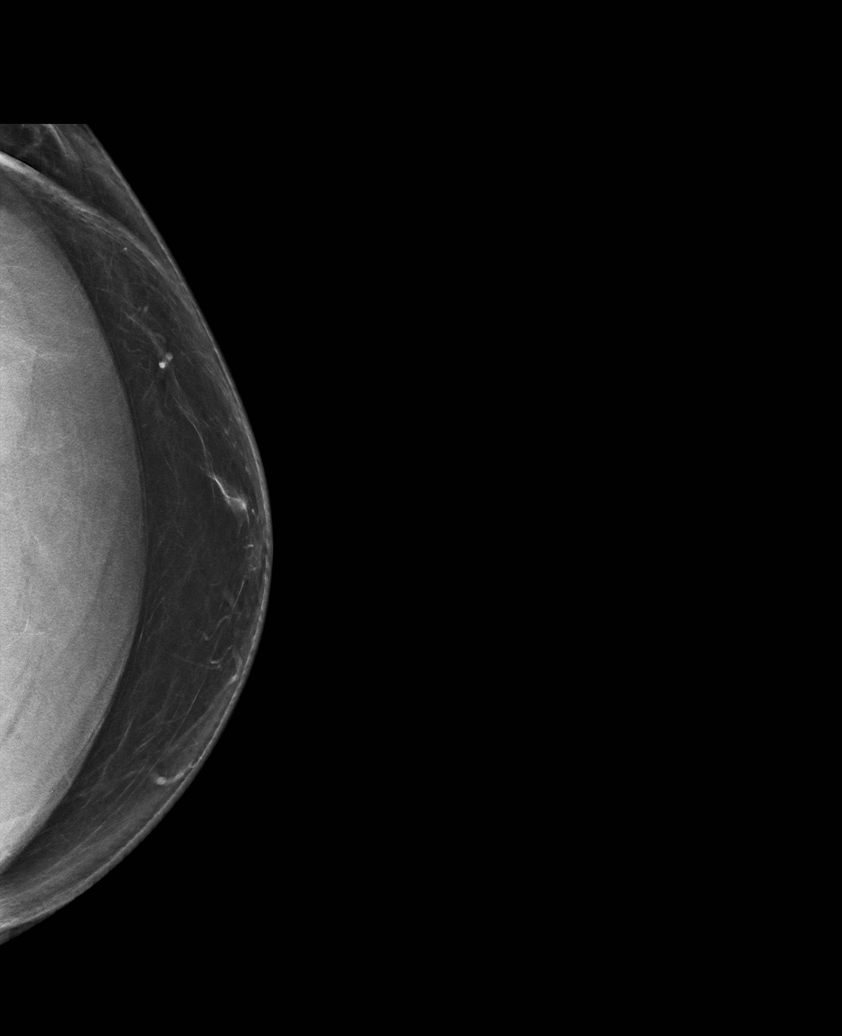

[R MLO synth-2D (1 of 2)]
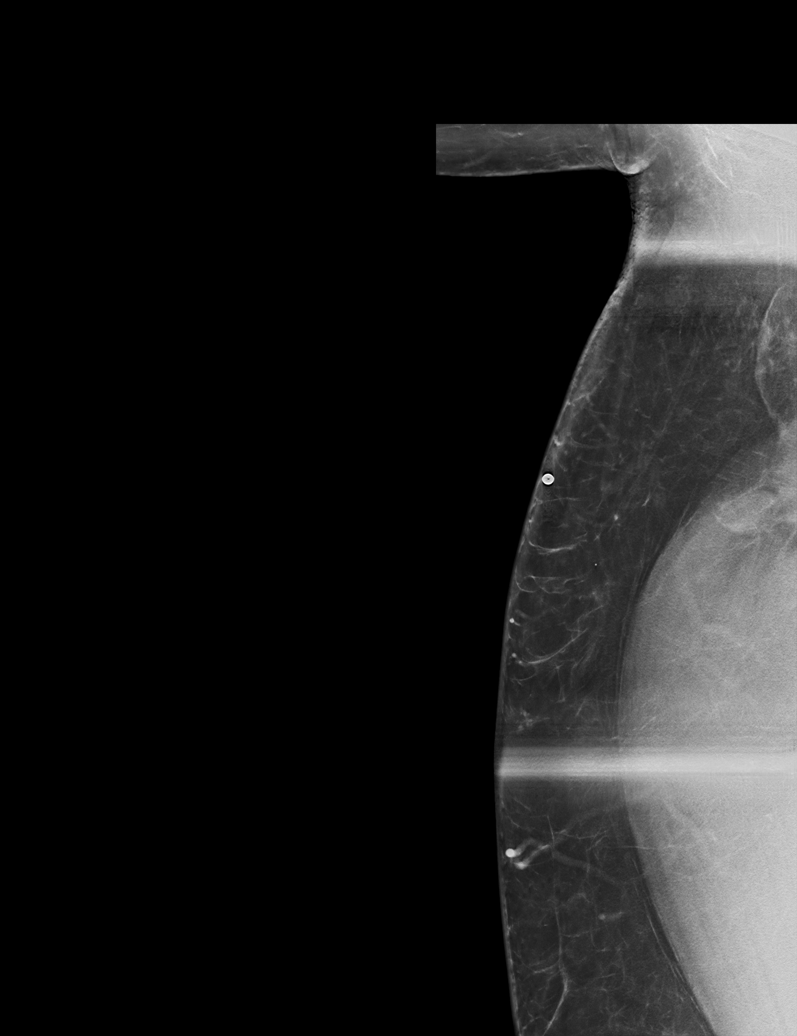

[R MLO synth-2D (2 of 2)]
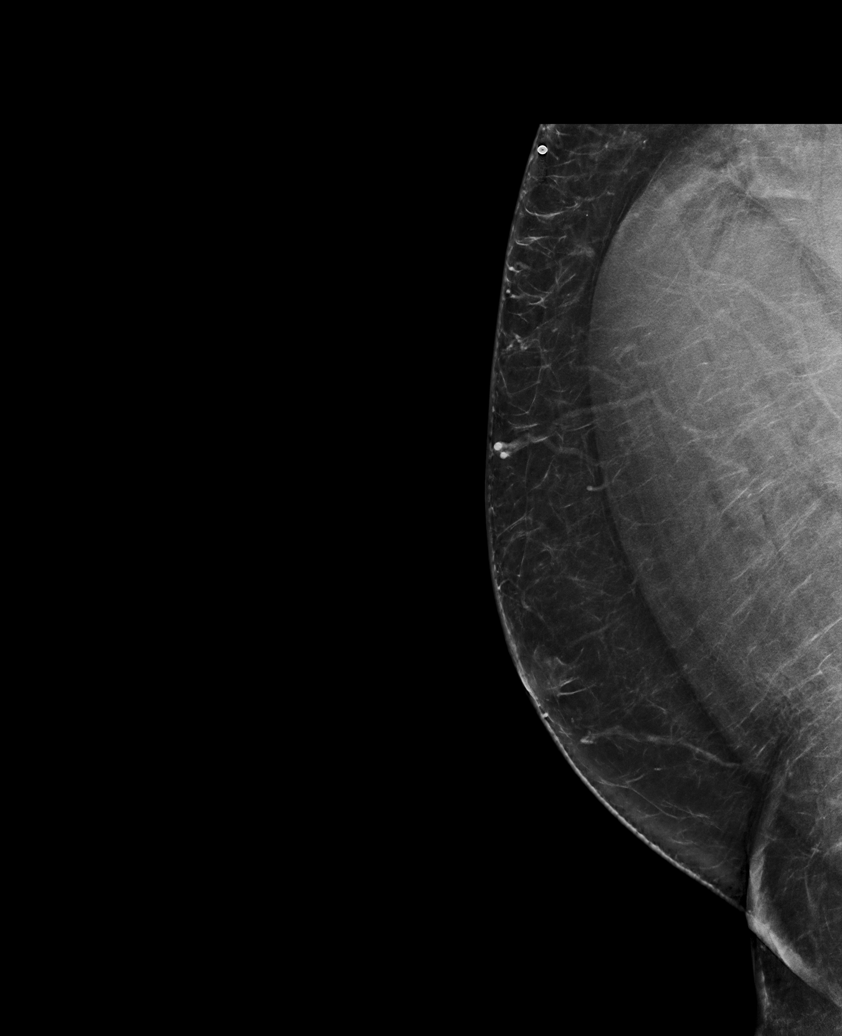

[R CC synth-2D]
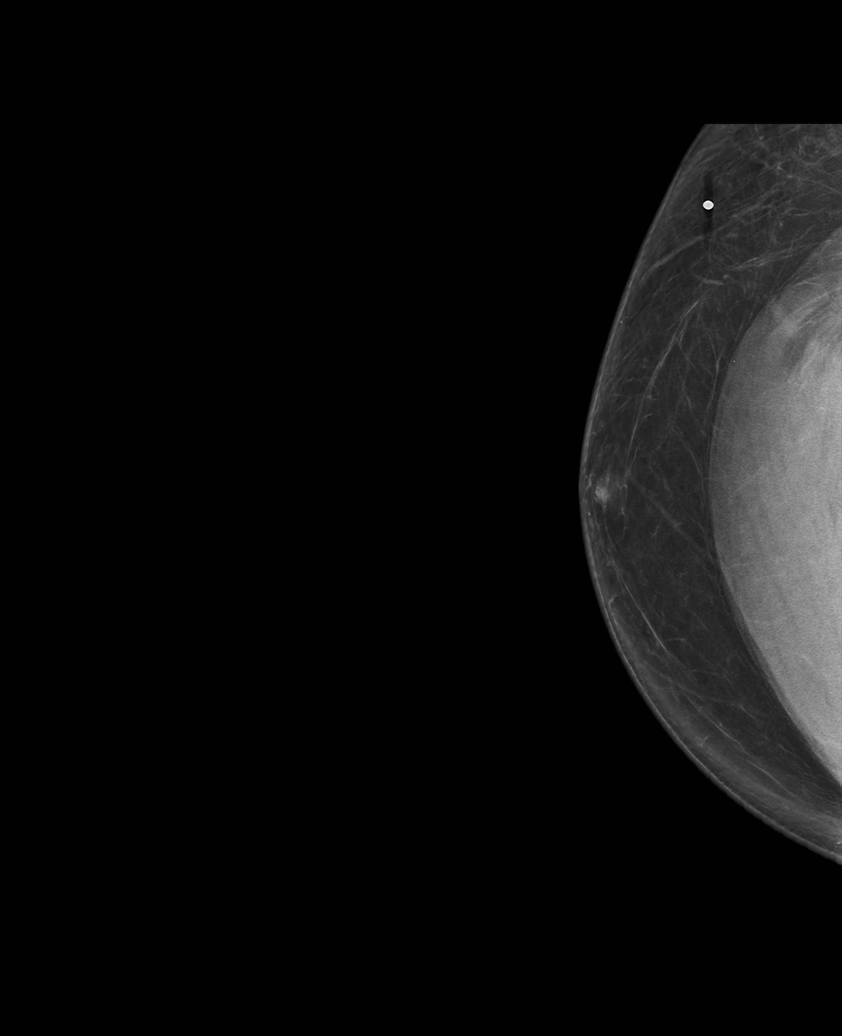

[R MLO tomo · tomo slice 41/81.0]
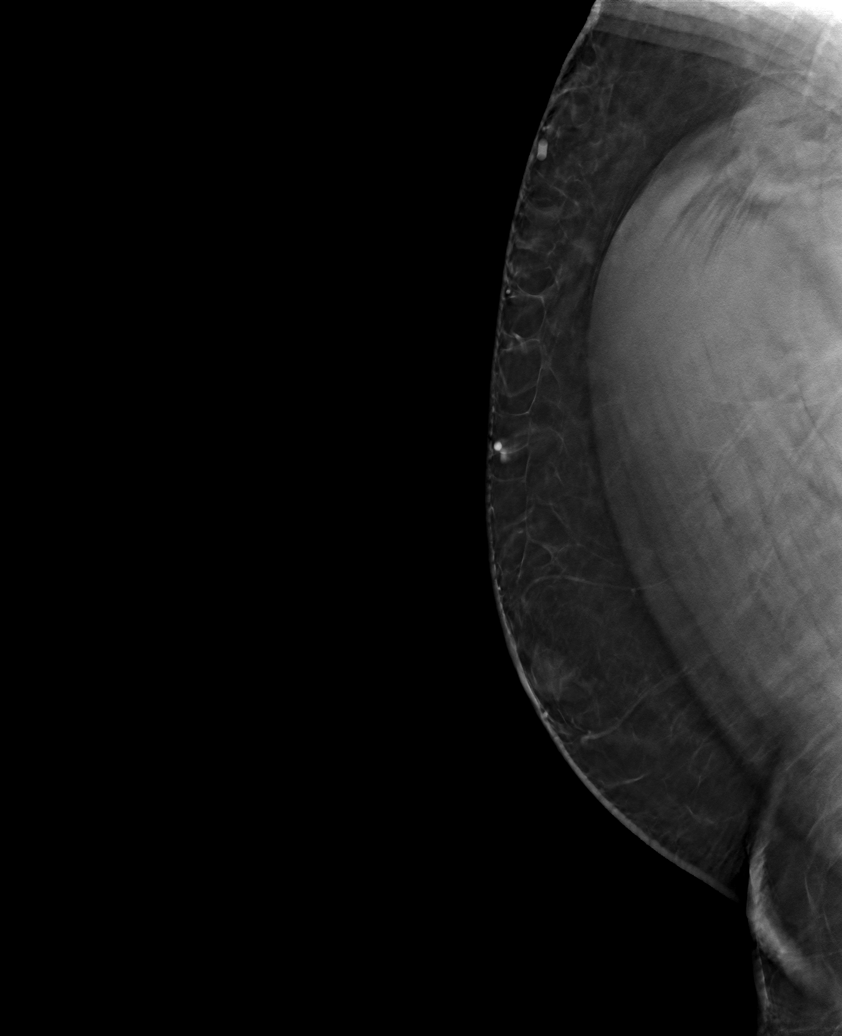

[6 of 30 positions shown; findings below may reference images not displayed]

FINDINGS: There is a contour abnormality in the superolateral pectoralis
muscle seen on the MLO and CC views. No other suspicious
mammographic findings.

Mammographic images were processed with CAD.

On physical exam, some lumpiness is pointed out by the patient.

Targeted ultrasound is performed, showing mild increased
echogenicity in the subcutaneous fat likely mild fat necrosis. No
focal mass.
IMPRESSION: Contour abnormality in the superolateral right pectoralis muscle.
The findings are consistent with muscular injury, perhaps a
significant tear. Recommend MRI of the chest for better evaluation.

RECOMMENDATION:
Chest MRI for further evaluation of the right pectoralis muscle.

I have discussed the findings and recommendations with the patient.
Results were also provided in writing at the conclusion of the
visit. If applicable, a reminder letter will be sent to the patient
regarding the next appointment.

BI-RADS CATEGORY  2: Benign.

## 2021-02-24 ENCOUNTER — Ambulatory Visit
Admission: RE | Admit: 2021-02-24 | Discharge: 2021-02-24 | Disposition: A | Discharge status: Home / Self Care | Payer: Commercial Managed Care - PPO | Source: Ambulatory Visit | Attending: Emergency Medicine | Admitting: Emergency Medicine

## 2021-02-24 ENCOUNTER — Other Ambulatory Visit: Payer: Self-pay

## 2021-02-24 VITALS — BP 154/118 | HR 119 | Temp 99.5°F | Resp 20

## 2021-02-24 DIAGNOSIS — Z113 Encounter for screening for infections with a predominantly sexual mode of transmission: Secondary | ICD-10-CM | POA: Insufficient documentation

## 2021-02-24 NOTE — ED Triage Notes (Signed)
Patient reports having a bump under the shaft of his penis. Patient requesting to be tested for STDs.

## 2021-02-24 NOTE — Discharge Instructions (Addendum)
The results of your STD testing today will be made available to you once received.  They will initially be posted to your MyChart and, if any of your results are abnormal, you will receive a phone call with those results along with further instructions regarding treatment.   Have a great new year!

## 2021-02-24 NOTE — ED Provider Notes (Signed)
[facility]    CSN: [id number] Arrival date & time: 02/24/21  0807    HISTORY  No chief complaint on file.  HPI Joseph Bullock is a 36 y.o. male. Patient reports having a bump under the shaft of his penis which is not painful, states he has had similar problems before, thinks it may be an ingrown hair but would like to have it looked at. Patient also requesting to be tested for STDs.   The history is provided by the patient.  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   Prediabetes 03/06/2016   Prehypertension 03/06/2016   OSA on CPAP 02/19/2014   Microhematuria 03/09/2012   OSA (obstructive sleep apnea) 01/07/2012   BMI 35.0-35.9,adult 11/24/2011   Nicotine addiction 11/24/2011   Past Surgical History:  Procedure Laterality Date   BICEPS TENDON REPAIR Right 10/27/2018   Procedure: PECTORALIS RECONSTRUCTION WITH ALLOGRAFT;  Surgeon: Tania Ade, MD;  Location: [facility];  Service: Orthopedics;  Laterality: Right;  REQUEST [place]  2018   TONSILLECTOMY AND ADENOIDECTOMY  1994    Home Medications    Prior to Admission medications   Not on File   Family History History reviewed. No pertinent family history. Social History Social History   Tobacco Use   Smoking status: Former    Types: Cigarettes    Quit date: 12/12/2009    Years since quitting: 11.2   Smokeless tobacco: Never  Vaping Use   Vaping Use: Never used  Substance Use Topics   Alcohol use: Yes    Alcohol/week: 3.0 standard drinks    Types: 3 Cans of beer per week   Drug use: No   Allergies   Patient has no known allergies.  Review of Systems Review of Systems Pertinent findings noted in history of present illness.   Physical Exam Triage Vital Signs ED Triage Vitals  Enc  Vitals Group     BP 12/20/20 0827 (!) 147/82     Pulse Rate 12/20/20 0827 72     Resp 12/20/20 0827 18     Temp 12/20/20 0827 98.3 F (36.8 C)     Temp Source 12/20/20 0827 Oral     SpO2 12/20/20 0827 98 %     Weight --      Height --      Head Circumference --      Peak Flow --      Pain Score 12/20/20 0826 5     Pain Loc --      Pain Edu? --      Excl. in [place]? --   No data found.  Updated Vital Signs BP (!) 154/118 (BP Location: Left Arm)    Pulse (!) 119    Temp 99.5 F (37.5 C) (Oral)    Resp 20    SpO2 96%   Physical Exam Vitals and nursing note reviewed.  Constitutional:      General: He is not in acute distress.    Appearance: Normal appearance. He is not ill-appearing.  HENT:     Head: Normocephalic and atraumatic.  Eyes:     General: Lids are normal.        Right eye: No discharge.  Left eye: No discharge.     Extraocular Movements: Extraocular movements intact.     Conjunctiva/sclera: Conjunctivae normal.     Right eye: Right conjunctiva is not injected.     Left eye: Left conjunctiva is not injected.  Neck:     Trachea: Trachea and phonation normal.  Cardiovascular:     Rate and Rhythm: Normal rate and regular rhythm.     Pulses: Normal pulses.     Heart sounds: Normal heart sounds. No murmur heard.   No friction rub. No gallop.  Pulmonary:     Effort: Pulmonary effort is normal. No accessory muscle usage, prolonged expiration or respiratory distress.     Breath sounds: Normal breath sounds. No stridor, decreased air movement or transmitted upper airway sounds. No decreased breath sounds, wheezing, rhonchi or rales.  Chest:     Chest wall: No tenderness.  Genitourinary:    Penis: Normal.      Comments: Pt provided a swab for testing.  There is a small ingrown hair at the base of penile shaft without concern for folliculitis.  Musculoskeletal:        General: Normal range of motion.     Cervical back: Normal range of motion and neck supple. Normal  range of motion.  Lymphadenopathy:     Cervical: No cervical adenopathy.  Skin:    General: Skin is warm and dry.     Findings: No erythema or rash.  Neurological:     General: No focal deficit present.     Mental Status: He is alert and oriented to person, place, and time.  Psychiatric:        Mood and Affect: Mood normal.        Behavior: Behavior normal.    Visual Acuity Right Eye Distance:   Left Eye Distance:   Bilateral Distance:    Right Eye Near:   Left Eye Near:    Bilateral Near:     UC Couse / Diagnostics / Procedures:    EKG  Radiology No results found.  Procedures Procedures (including critical care time)  UC Diagnoses / Final Clinical Impressions(s)   I have reviewed the triage vital signs and the nursing notes.  Pertinent labs & imaging results that were available during my care of the patient were reviewed by me and considered in my medical decision making (see chart for details).   Final diagnoses:  Screening examination for STD (sexually transmitted disease)   Patient reassured, STD screening performed as requested.  Return precautions advised.  ED Prescriptions   None    PDMP not reviewed this encounter.  Pending results:  Labs Reviewed  RPR  HIV ANTIBODY (ROUTINE TESTING W REFLEX)  CYTOLOGY, (ORAL, ANAL, URETHRAL) ANCILLARY ONLY    Medications Ordered in UC: Medications - No data to display  Disposition Upon Discharge:  Condition: stable for discharge home  Patient presents today with concerns for exposure to sexually transmitted disease, requesting testing.  STD screening was performed as indicated.  Patient has been advised that the results of screening will be made available to them via MyChart and, if there are any positive findings, they will be contacted by phone, recommendations for treatment will be advised and prescriptions will be provided as indicated based on clinical guidelines.  Patient has also been advised that if  treatment is recommended, they should abstain from sexual intercourse of all forms until treatment is complete.  Patient has further been advised that once treatment is complete, they have not  had a complete resolution of their symptoms, if any, they should continue to abstain from sexual intercourse with all forms and follow-up with her primary care provider or return to urgent care for repeat testing.  As such, the patient has been evaluated and assessed, work-up was performed and treatment was provided in alignment with urgent care protocols and evidence based medicine.  Patient/parent/caregiver has been advised that the patient may require follow up for further testing and/or treatment if the symptoms continue in spite of treatment, as clinically indicated and appropriate.  Routine symptom specific, illness specific and/or disease specific instructions were discussed with the patient and/or caregiver at length.  Prevention strategies for avoiding STD exposure were also discussed.  The patient will follow up with their current PCP if and as advised. If the patient does not currently have a PCP we will assist them in obtaining one.   The patient may need specialty follow up if the symptoms continue, in spite of conservative treatment and management, for further workup, evaluation, consultation and treatment as clinically indicated and appropriate.  Patient/parent/caregiver verbalized understanding and agreement of plan as discussed.  All questions were addressed during visit.  Please see discharge instructions below for further details of plan.  Discharge Instructions:   Discharge Instructions      The results of your STD testing today will be made available to you once received.  They will initially be posted to your MyChart and, if any of your results are abnormal, you will receive a phone call with those results along with further instructions regarding treatment.       This office note  has been dictated using Museum/gallery curator.  Unfortunately, and despite my best efforts, this method of dictation can sometimes lead to occasional typographical or grammatical errors.  I apologize in advance if this occurs.      Lynden Oxford Scales, [place] 02/24/21 [phone]

## 2021-02-26 LAB — CYTOLOGY, (ORAL, ANAL, URETHRAL) ANCILLARY ONLY
Chlamydia: NEGATIVE
Comment: NEGATIVE
Comment: NEGATIVE
Comment: NORMAL
Neisseria Gonorrhea: NEGATIVE
Trichomonas: POSITIVE — AB

## 2021-02-26 LAB — HIV ANTIBODY (ROUTINE TESTING W REFLEX): HIV Screen 4th Generation wRfx: NONREACTIVE

## 2021-02-26 LAB — RPR: RPR Ser Ql: NONREACTIVE

## 2021-02-27 ENCOUNTER — Telehealth: Payer: Self-pay

## 2021-02-27 MED ORDER — METRONIDAZOLE 500 MG PO TABS: 2000.0000 mg | ORAL_TABLET | Freq: Once | ORAL | 0 refills | Status: AC

## 2021-03-06 ENCOUNTER — Ambulatory Visit: Payer: Commercial Managed Care - PPO

## 2021-03-07 ENCOUNTER — Ambulatory Visit: Payer: Commercial Managed Care - PPO

## 2021-03-08 ENCOUNTER — Ambulatory Visit (HOSPITAL_COMMUNITY): Payer: Self-pay

## 2021-04-08 ENCOUNTER — Other Ambulatory Visit: Payer: Self-pay

## 2021-04-08 ENCOUNTER — Ambulatory Visit
Admission: RE | Admit: 2021-04-08 | Discharge: 2021-04-08 | Disposition: A | Discharge status: Home / Self Care | Payer: Commercial Managed Care - PPO | Source: Ambulatory Visit | Attending: Emergency Medicine | Admitting: Emergency Medicine

## 2021-04-08 VITALS — BP 135/95 | HR 73 | Temp 97.8°F | Resp 20

## 2021-04-08 DIAGNOSIS — Z113 Encounter for screening for infections with a predominantly sexual mode of transmission: Secondary | ICD-10-CM | POA: Insufficient documentation

## 2021-04-08 NOTE — Discharge Instructions (Addendum)
The results of your STD testing today will be made available to you once they are complete, this typically takes 3 to 5 days.  They will initially be posted to your MyChart and, if any of your results are abnormal, you will receive a phone call with those results along with further instructions regarding any further treatment, if needed.    Please remember that the only way to prevent transmission of sexually transmitted disease when having sexual intercourse is to use condoms.  Repeat sexually transmitted infections can cause scarring in your fallopian tubes which will interfere with your ability to conceive later in life.  Repeat exposures to sexually transmitted diseases can also increase your risk of human papilloma virus which causes cervical cancer and genital warts.   Please remember that the only way to prevent transmission of sexually transmitted disease when having sexual intercourse is to use condoms.  Repeat sexually transmitted infections can cause scarring of the tubes that carry sperm from your testicles to your penis during ejaculation.  This can interfere with your your ability to have children.  Repeat exposures to sexually transmitted diseases can also increase your risk of human papilloma virus which causes genital warts.   Thank you for visiting urgent care today.  I appreciate the opportunity to participate in your care.

## 2021-04-08 NOTE — ED Triage Notes (Signed)
Pt requesting to be tested for STDs, he reports having some some discomfort in penis at times.

## 2021-04-08 NOTE — ED Provider Notes (Signed)
[facility]    CSN: [id number] Arrival date & time: 04/08/21  0820    HISTORY   Chief Complaint  Patient presents with   SEXUALLY TRANSMITTED DISEASE   HPI Joseph Bullock is a 36 y.o. male. Pt requesting to be tested for STDs, he reports having some mild, intermittent burning with urination.  Patient denies frank penile discharge.  Patient states he would like to have HIV and syphilis testing at this time.  MAR reviewed, patient tested positive for trichomonas on February 25, 2020.  The history is provided by the patient.  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   Prediabetes 03/06/2016   Prehypertension 03/06/2016   OSA on CPAP 02/19/2014   Microhematuria 03/09/2012   OSA (obstructive sleep apnea) 01/07/2012   BMI 35.0-35.9,adult 11/24/2011   Nicotine addiction 11/24/2011   Past Surgical History:  Procedure Laterality Date   BICEPS TENDON REPAIR Right 10/27/2018   Procedure: PECTORALIS RECONSTRUCTION WITH ALLOGRAFT;  Surgeon: Tania Ade, MD;  Location: [facility];  Service: Orthopedics;  Laterality: Right;  REQUEST [place]ten  2018   TONSILLECTOMY AND ADENOIDECTOMY  1994    Home Medications    Prior to Admission medications   Not on File   Family History History reviewed. No pertinent family history. Social History Social History   Tobacco Use   Smoking status: Former    Types: Cigarettes    Quit date: 12/12/2009    Years since quitting: 11.3   Smokeless tobacco: Never  Vaping Use   Vaping Use: Never used  Substance Use Topics   Alcohol use: Yes    Alcohol/week: 3.0 standard drinks    Types: 3 Cans of beer per week   Drug use: No   Allergies   Patient has no known allergies.  Review of Systems Review of Systems Pertinent findings  noted in history of present illness.   Physical Exam Triage Vital Signs ED Triage Vitals  Enc Vitals Group     BP 12/20/20 0827 (!) 147/82     Pulse Rate 12/20/20 0827 72     Resp 12/20/20 0827 18     Temp 12/20/20 0827 98.3 F (36.8 C)     Temp Source 12/20/20 0827 Oral     SpO2 12/20/20 0827 98 %     Weight --      Height --      Head Circumference --      Peak Flow --      Pain Score 12/20/20 0826 5     Pain Loc --      Pain Edu? --      Excl. in [place]? --   No data found.  Updated Vital Signs BP (!) 135/95 (BP Location: Left Arm)    Pulse 73    Temp 97.8 F (36.6 C) (Oral)    Resp 20    SpO2 95%   Physical Exam Vitals and nursing note reviewed.  Constitutional:      General: He is not in acute distress.    Appearance: Normal appearance. He is not ill-appearing.  HENT:     Head: Normocephalic and atraumatic.  Eyes:     General: Lids are normal.        Right  eye: No discharge.        Left eye: No discharge.     Extraocular Movements: Extraocular movements intact.     Conjunctiva/sclera: Conjunctivae normal.     Right eye: Right conjunctiva is not injected.     Left eye: Left conjunctiva is not injected.  Neck:     Trachea: Trachea and phonation normal.  Cardiovascular:     Rate and Rhythm: Normal rate and regular rhythm.     Pulses: Normal pulses.     Heart sounds: Normal heart sounds. No murmur heard.   No friction rub. No gallop.  Pulmonary:     Effort: Pulmonary effort is normal. No accessory muscle usage, prolonged expiration or respiratory distress.     Breath sounds: Normal breath sounds. No stridor, decreased air movement or transmitted upper airway sounds. No decreased breath sounds, wheezing, rhonchi or rales.  Chest:     Chest wall: No tenderness.  Genitourinary:    Comments: Pt politely declines GU exam, pt did provide a penile swab for testing.   Musculoskeletal:        General: Normal range of motion.     Cervical back: Normal range of motion and  neck supple. Normal range of motion.  Lymphadenopathy:     Cervical: No cervical adenopathy.  Skin:    General: Skin is warm and dry.     Findings: No erythema or rash.  Neurological:     General: No focal deficit present.     Mental Status: He is alert and oriented to person, place, and time.  Psychiatric:        Mood and Affect: Mood normal.        Behavior: Behavior normal.    Visual Acuity Right Eye Distance:   Left Eye Distance:   Bilateral Distance:    Right Eye Near:   Left Eye Near:    Bilateral Near:     UC Couse / Diagnostics / Procedures:    EKG  Radiology No results found.  Procedures Procedures (including critical care time)  UC Diagnoses / Final Clinical Impressions(s)   I have reviewed the triage vital signs and the nursing notes.  Pertinent labs & imaging results that were available during my care of the patient were reviewed by me and considered in my medical decision making (see chart for details).    Final diagnoses:  Screening examination for STD (sexually transmitted disease)   STD screening was performed, patient advised that the results be posted to their MyChart and if any of the results are positive, they will be notified by phone, further treatment will be provided as indicated based on results of STD screening. Return precautions advised.  Drug allergies reviewed, all questions addressed.     ED Prescriptions   None    PDMP not reviewed this encounter.  Pending results:  Labs Reviewed  RPR  HIV ANTIBODY (ROUTINE TESTING W REFLEX)  CYTOLOGY, (ORAL, ANAL, URETHRAL) ANCILLARY ONLY    Medications Ordered in UC: Medications - No data to display  Disposition Upon Discharge:  Condition: stable for discharge home  Patient presented with concern for an acute illness with associated systemic symptoms and significant discomfort requiring urgent management. In my opinion, this is a condition that a prudent lay person (someone who  possesses an average knowledge of health and medicine) may potentially expect to result in complications if not addressed urgently such as respiratory distress, impairment of bodily function or dysfunction of bodily organs.   As such,  the patient has been evaluated and assessed, work-up was performed and treatment was provided in alignment with urgent care protocols and evidence based medicine.  Patient/parent/caregiver has been advised that the patient may require follow up for further testing and/or treatment if the symptoms continue in spite of treatment, as clinically indicated and appropriate.  Routine symptom specific, illness specific and/or disease specific instructions were discussed with the patient and/or caregiver at length.  Prevention strategies for avoiding STD exposure were also discussed.  The patient will follow up with their current PCP if and as advised. If the patient does not currently have a PCP we will assist them in obtaining one.   The patient may need specialty follow up if the symptoms continue, in spite of conservative treatment and management, for further workup, evaluation, consultation and treatment as clinically indicated and appropriate.  Patient/parent/caregiver verbalized understanding and agreement of plan as discussed.  All questions were addressed during visit.  Please see discharge instructions below for further details of plan.  Discharge Instructions:   Discharge Instructions      The results of your STD testing today will be made available to you once they are complete, this typically takes 3 to 5 days.  They will initially be posted to your MyChart and, if any of your results are abnormal, you will receive a phone call with those results along with further instructions regarding any further treatment, if needed.    Please remember that the only way to prevent transmission of sexually transmitted disease when having sexual intercourse is to use condoms.   Repeat sexually transmitted infections can cause scarring in your fallopian tubes which will interfere with your ability to conceive later in life.  Repeat exposures to sexually transmitted diseases can also increase your risk of human papilloma virus which causes cervical cancer and genital warts.   Please remember that the only way to prevent transmission of sexually transmitted disease when having sexual intercourse is to use condoms.  Repeat sexually transmitted infections can cause scarring of the tubes that carry sperm from your testicles to your penis during ejaculation.  This can interfere with your your ability to have children.  Repeat exposures to sexually transmitted diseases can also increase your risk of human papilloma virus which causes genital warts.   Thank you for visiting urgent care today.  I appreciate the opportunity to participate in your care.     This office note has been dictated using Museum/gallery curator.  Unfortunately, and despite my best efforts, this method of dictation can sometimes lead to occasional typographical or grammatical errors.  I apologize in advance if this occurs.      Lynden Oxford Scales, [place] 04/08/21 [phone]

## 2021-04-09 ENCOUNTER — Telehealth (HOSPITAL_COMMUNITY): Payer: Self-pay | Admitting: Emergency Medicine

## 2021-04-09 LAB — CYTOLOGY, (ORAL, ANAL, URETHRAL) ANCILLARY ONLY
Chlamydia: NEGATIVE
Comment: NEGATIVE
Comment: NEGATIVE
Comment: NORMAL
Neisseria Gonorrhea: NEGATIVE
Trichomonas: POSITIVE — AB

## 2021-04-09 LAB — RPR: RPR Ser Ql: NONREACTIVE

## 2021-04-09 MED ORDER — METRONIDAZOLE 500 MG PO TABS: 2000.0000 mg | ORAL_TABLET | Freq: Once | ORAL | 0 refills | Status: AC

## 2022-12-01 ENCOUNTER — Ambulatory Visit
Admission: RE | Admit: 2022-12-01 | Discharge: 2022-12-01 | Disposition: A | Discharge status: Home / Self Care | Payer: Commercial Managed Care - PPO | Source: Ambulatory Visit | Attending: Internal Medicine | Admitting: Internal Medicine

## 2022-12-01 VITALS — BP 152/98 | HR 85 | Temp 99.4°F | Resp 20

## 2022-12-01 DIAGNOSIS — N481 Balanitis: Secondary | ICD-10-CM

## 2022-12-01 DIAGNOSIS — R21 Rash and other nonspecific skin eruption: Secondary | ICD-10-CM | POA: Diagnosis not present

## 2022-12-01 DIAGNOSIS — Z7251 High risk heterosexual behavior: Secondary | ICD-10-CM | POA: Diagnosis not present

## 2022-12-01 NOTE — Discharge Instructions (Addendum)
I suspect you had self limiting balanitis. At this stage I would wait to use any medication since you are recovering without any.  We will let you know about your test results tomorrow for both the blood work, oral and penile swabs.  If you have not heard from [place] by 2 PM then call the clinic directly and we will do a thorough review for you.

## 2022-12-01 NOTE — ED Provider Notes (Signed)
[facility]  Note:  This document was prepared using Conservation officer, historic buildings and may include unintentional dictation errors.  MRN: [id number] DOB: 07-Oct-1985  Subjective:   Joseph Bullock is a 37 y.o. male presenting for STI testing. Had unprotected sex and would like to have an oral cytology, penile cytology done.  He did have a red spot over the head of the penis that is now resolving.  Had a similar lesion of the throat but is also gone.  Denies dysuria, hematuria, urinary frequency, penile discharge, penile swelling, testicular pain, testicular swelling, anal pain, groin pain.   No current facility-administered medications for this encounter. No current outpatient medications on file.   No Known Allergies  Past Medical History:  Diagnosis Date   Sleep apnea 2016   used CPAP for 2 years- have not used CPAP in 2 years     Past Surgical History:  Procedure Laterality Date   BICEPS TENDON REPAIR Right 10/27/2018   Procedure: PECTORALIS RECONSTRUCTION WITH ALLOGRAFT;  Surgeon: Jones Broom, MD;  Location: [facility];  Service: Orthopedics;  Laterality: Right;  REQUEST 120 MINUTES   NASAL SEPTUM SURGERY  2018   TONSILLECTOMY AND ADENOIDECTOMY  1994    No family history on file.  Social History   Tobacco Use   Smoking status: Former    Current packs/day: 0.00    Types: Cigarettes    Quit date: 12/12/2009    Years since quitting: 12.9   Smokeless tobacco: Never  Vaping Use   Vaping status: Never Used  Substance Use Topics   Alcohol use: Yes    Alcohol/week: 3.0 standard drinks of alcohol    Types: 3 Cans of beer per week   Drug use: No    ROS   Objective:   Vitals: BP (!) 152/98 (BP Location: Right Arm)   Pulse 85   Temp 99.4 F (37.4 C) (Oral)   Resp 20   SpO2 98%   Physical Exam Constitutional:      General: He is not in acute distress.    Appearance: Normal appearance. He is well-developed and normal weight. He is not  ill-appearing, toxic-appearing or diaphoretic.  HENT:     Head: Normocephalic and atraumatic.     Right Ear: External ear normal.     Left Ear: External ear normal.     Nose: Nose normal.     Mouth/Throat:     Pharynx: Oropharynx is clear.  Eyes:     General: No scleral icterus.       Right eye: No discharge.        Left eye: No discharge.     Extraocular Movements: Extraocular movements intact.  Cardiovascular:     Rate and Rhythm: Normal rate.  Pulmonary:     Effort: Pulmonary effort is normal.  Genitourinary:    Penis: Circumcised. No phimosis, paraphimosis, hypospadias, erythema, tenderness, discharge, swelling or lesions.     Musculoskeletal:     Cervical back: Normal range of motion.  Neurological:     Mental Status: He is alert and oriented to person, place, and time.  Psychiatric:        Mood and Affect: Mood normal.        Behavior: Behavior normal.        Thought Content: Thought content normal.        Judgment: Judgment normal.     Assessment and Plan :   PDMP not reviewed this encounter.  1. Balanitis  2. Penile rash   3. Unprotected sex    Recommended conservative management for suspected balanitis.  Labs pending, will treat as appropriate based on the results.   Wallis Bamberg, PA-C 12/01/22 1034

## 2022-12-01 NOTE — ED Triage Notes (Signed)
Pt requesting STD testing-denies known exposure-states he did have a rash to penis and a sore throat that both are improved-NAD-steady gait

## 2022-12-02 LAB — RPR: RPR Ser Ql: NONREACTIVE

## 2022-12-02 LAB — CYTOLOGY, (ORAL, ANAL, URETHRAL) ANCILLARY ONLY
Chlamydia: NEGATIVE
Chlamydia: NEGATIVE
Comment: NEGATIVE
Comment: NEGATIVE
Comment: NEGATIVE
Comment: NEGATIVE
Comment: NORMAL
Comment: NORMAL
Neisseria Gonorrhea: NEGATIVE
Neisseria Gonorrhea: NEGATIVE
Trichomonas: NEGATIVE
Trichomonas: POSITIVE — AB

## 2022-12-02 LAB — HIV ANTIBODY (ROUTINE TESTING W REFLEX): HIV Screen 4th Generation wRfx: NONREACTIVE

## 2022-12-03 ENCOUNTER — Telehealth: Payer: Self-pay

## 2022-12-03 MED ORDER — METRONIDAZOLE 500 MG PO TABS: ORAL_TABLET | ORAL | 0 refills | Status: DC

## 2022-12-03 NOTE — Telephone Encounter (Signed)
Pt called in regards to + result on Trich. Pt is requesting tx.

## 2022-12-03 NOTE — Telephone Encounter (Signed)
Rx to pharmacy

## 2022-12-21 ENCOUNTER — Ambulatory Visit
Admission: RE | Admit: 2022-12-21 | Discharge: 2022-12-21 | Disposition: A | Discharge status: Home / Self Care | Payer: Commercial Managed Care - PPO | Source: Ambulatory Visit | Attending: Internal Medicine | Admitting: Internal Medicine

## 2022-12-21 VITALS — BP 151/112 | HR 90 | Temp 98.1°F | Resp 16

## 2022-12-21 DIAGNOSIS — N489 Disorder of penis, unspecified: Secondary | ICD-10-CM | POA: Insufficient documentation

## 2022-12-21 DIAGNOSIS — N481 Balanitis: Secondary | ICD-10-CM | POA: Insufficient documentation

## 2022-12-21 MED ORDER — CLOTRIMAZOLE 1 % EX CREA: TOPICAL_CREAM | CUTANEOUS | 0 refills | Status: DC

## 2022-12-21 NOTE — ED Triage Notes (Signed)
Pt presents to UC w/ c/o rash on shaft of penis since yesterday. Pt denies pain or itching.

## 2022-12-21 NOTE — ED Provider Notes (Signed)
[facility]    CSN: [id number] Arrival date & time: 12/21/22  8469      History   Chief Complaint Chief Complaint  Patient presents with   Rash    Entered by patient    HPI Joseph Bullock is a 37 y.o. male presents for penile rash.  Patient reports over the weekend he had some chafing on his penis and developed a slight red area that is concern for rash.  He also noticed a skin colored bump just proximal to this area and is concerned for HSV.  Denies any pain, itching, swelling, redness, drainage of the penis.  No penile discharge or testicular pain or swelling.  No STD concern.  He was seen in urgent care on October 8 for similar symptoms and was instructed conservative management for balanitis.  He also had STD testing done at that time that was positive for trichomonas for which he did receive treatment with metronidazole.  Patient has no other concerns at this time.    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   Prediabetes 03/06/2016   Prehypertension 03/06/2016   OSA on CPAP 02/19/2014   Microhematuria 03/09/2012   OSA (obstructive sleep apnea) 01/07/2012   BMI 35.0-35.9,adult 11/24/2011   Nicotine addiction 11/24/2011    Past Surgical History:  Procedure Laterality Date   BICEPS TENDON REPAIR Right 10/27/2018   Procedure: PECTORALIS RECONSTRUCTION WITH ALLOGRAFT;  Surgeon: Jones Broom, MD;  Location: [facility];  Service: Orthopedics;  Laterality: Right;  REQUEST 120 MINUTES   NASAL SEPTUM SURGERY  2018   TONSILLECTOMY AND ADENOIDECTOMY  1994       Home Medications    Prior to Admission medications   Medication Sig Start Date End Date Taking? Authorizing Provider  clotrimazole (LOTRIMIN) 1 % cream Apply to affected area 2 times daily 12/21/22  Yes Radford Pax, NP  metroNIDAZOLE (FLAGYL) 500 MG tablet Take 4 tablets once. 12/03/22   Elson Areas, PA-C    Family History History reviewed. No pertinent family history.  Social History Social History   Tobacco Use   Smoking status: Former    Current packs/day: 0.00    Types: Cigarettes    Quit date: 12/12/2009    Years since quitting: 13.0   Smokeless tobacco: Never  Vaping Use   Vaping status: Never Used  Substance Use Topics   Alcohol use: Not Currently    Comment: weekends   Drug use: No     Allergies   Patient has no known allergies.   Review of Systems Review of Systems  Skin:  Positive for rash.     Physical Exam Triage Vital Signs ED Triage Vitals  Encounter Vitals Group     BP 12/21/22 0958 (!) 151/112     Systolic BP Percentile --      Diastolic BP Percentile --      Pulse Rate 12/21/22 0958 90     Resp 12/21/22 0958 16     Temp 12/21/22 0958 98.1 F (36.7 C)     Temp Source 12/21/22 0958 Oral     SpO2 12/21/22 0958 98 %     Weight --      Height --      Head  Circumference --      Peak Flow --      Pain Score 12/21/22 1002 0     Pain Loc --      Pain Education --      Exclude from Growth Chart --    No data found.  Updated Vital Signs BP (!) 151/112 (BP Location: Right Arm)   Pulse 90   Temp 98.1 F (36.7 C) (Oral)   Resp 16   SpO2 98%   Visual Acuity Right Eye Distance:   Left Eye Distance:   Bilateral Distance:    Right Eye Near:   Left Eye Near:    Bilateral Near:     Physical Exam Vitals and nursing note reviewed. Chaperone present: Adriana CMA.  Constitutional:      General: He is not in acute distress.    Appearance: Normal appearance. He is not ill-appearing.  HENT:     Head: Normocephalic and atraumatic.  Eyes:     Pupils: Pupils are equal, round, and reactive to light.  Cardiovascular:     Rate and Rhythm: Normal rate.  Pulmonary:     Effort: Pulmonary effort is normal.  Genitourinary:    Penis: Circumcised.         Comments: There is a very small circular area of mild erythema with a skin colored papule just proximal to this.  There is no vesicles or lesions.  No drainage, swelling, erythema, warmth, tenderness. Skin:    General: Skin is warm and dry.  Neurological:     General: No focal deficit present.     Mental Status: He is alert and oriented to person, place, and time.  Psychiatric:        Mood and Affect: Mood normal.        Behavior: Behavior normal.      UC Treatments / Results  Labs (all labs ordered are listed, but only abnormal results are displayed) Labs Reviewed  HSV CULTURE AND TYPING    EKG   Radiology No results found.  Procedures Procedures (including critical care time)  Medications Ordered in UC Medications - No data to display  Initial Impression / Assessment and Plan / UC Course  I have reviewed the triage vital signs and the nursing notes.  Pertinent labs & imaging results that were available during my care of the patient were reviewed by me and considered in my medical decision making (see chart for details).     Reviewed exam and symptoms with patient.  No red flags.  Discussed balanitis, start clotrimazole topically.  Discussed bump is likely penile papule but given concern for HSV will do testing and contact patient if this is positive.  Advised PCP follow-up if symptoms do not improve.  ER precaution reviewed. Final Clinical Impressions(s) / UC Diagnoses   Final diagnoses:  Penile lesion  Balanitis     Discharge Instructions      The clinic will contact you with results of the testing done today if positive.  Start clotrimazole topically to affected area twice daily.  Please follow-up with your PCP if your symptoms do not improve.  Please go to the ER for any worsening symptoms.  I hope you feel better soon!    ED Prescriptions     Medication Sig Dispense Auth. Provider   clotrimazole (LOTRIMIN) 1 % cream Apply to affected area 2  times daily 15 g Radford Pax, NP      PDMP not reviewed this encounter.   Stacie Acres,  Hipolito Bayley, NP 12/21/22 1032

## 2022-12-21 NOTE — Discharge Instructions (Addendum)
The clinic will contact you with results of the testing done today if positive.  Start clotrimazole topically to affected area twice daily.  Please follow-up with your PCP if your symptoms do not improve.  Please go to the ER for any worsening symptoms.  I hope you feel better soon!

## 2022-12-24 ENCOUNTER — Telehealth: Payer: Self-pay

## 2022-12-24 LAB — HSV CULTURE AND TYPING

## 2022-12-24 MED ORDER — VALACYCLOVIR HCL 1 G PO TABS: 1000.0000 mg | ORAL_TABLET | Freq: Two times a day (BID) | ORAL | 0 refills | Status: AC

## 2022-12-24 NOTE — Telephone Encounter (Signed)
Contacted patient by phone.  Verified identity using two identifiers.  Provided positive result.  Reviewed safe sex practices, notifying partners, and refraining from sexual activities for 7 days from time of treatment.  Patient verified understanding, all questions answered.    Per protocol, pt requires treatment with Valtrex.  Reviewed with patient, verified pharmacy, prescription sent.

## 2023-02-04 ENCOUNTER — Ambulatory Visit
Admission: RE | Admit: 2023-02-04 | Discharge: 2023-02-04 | Disposition: A | Discharge status: Home / Self Care | Payer: Commercial Managed Care - PPO | Source: Ambulatory Visit | Attending: Internal Medicine | Admitting: Internal Medicine

## 2023-02-04 VITALS — BP 167/116 | HR 87 | Temp 99.1°F | Resp 20

## 2023-02-04 DIAGNOSIS — N342 Other urethritis: Secondary | ICD-10-CM | POA: Diagnosis present

## 2023-02-04 MED ORDER — CEFTRIAXONE SODIUM 500 MG IJ SOLR
500.0000 mg | INTRAMUSCULAR | Status: DC
  Administered 2023-02-04: 500 mg via INTRAMUSCULAR

## 2023-02-04 MED ORDER — DOXYCYCLINE HYCLATE 100 MG PO CAPS: 100.0000 mg | ORAL_CAPSULE | Freq: Two times a day (BID) | ORAL | 0 refills | Status: DC

## 2023-02-04 NOTE — Discharge Instructions (Addendum)
Avoid all forms of sexual intercourse (oral, vaginal, anal) for the next 7 days to avoid spreading/reinfecting or at least until we can see what kinds of infection results are positive.  Abstaining for 2 weeks would be better but at least 1 week is required.  We will let you know about your test results from the swab we did today and if you need any prescriptions for antibiotics or changes to your treatment from today.  

## 2023-02-04 NOTE — ED Triage Notes (Signed)
Pt c/o penile d/c and dysuria x 5 days-NAD-steady gait

## 2023-02-04 NOTE — ED Provider Notes (Signed)
[facility]  Note:  This document was prepared using Conservation officer, historic buildings and may include unintentional dictation errors.  MRN: [id number] DOB: 01/15/1986  Subjective:   Joseph Bullock is a 37 y.o. male presenting for 5-day history of acute onset penile discharge, dysuria.  Patient would like a complete STI check.  He is requesting empiric treatment.  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  No current facility-administered medications for this encounter.  Current Outpatient Medications:    clotrimazole (LOTRIMIN) 1 % cream, Apply to affected area 2 times daily, Disp: 15 g, Rfl: 0   metroNIDAZOLE (FLAGYL) 500 MG tablet, Take 4 tablets once., Disp: 4 tablet, Rfl: 0   No Known Allergies  Past Medical History:  Diagnosis Date   Sleep apnea 2016   used CPAP for 2 years- have not used CPAP in 2 years     Past Surgical History:  Procedure Laterality Date   BICEPS TENDON REPAIR Right 10/27/2018   Procedure: PECTORALIS RECONSTRUCTION WITH ALLOGRAFT;  Surgeon: Jones Broom, MD;  Location: [facility];  Service: Orthopedics;  Laterality: Right;  REQUEST 120 MINUTES   NASAL SEPTUM SURGERY  2018   TONSILLECTOMY AND ADENOIDECTOMY  1994    No family history on file.  Social History   Tobacco Use   Smoking status: Former    Current packs/day: 0.00    Types: Cigarettes    Quit date: 12/12/2009    Years since quitting: 13.1   Smokeless tobacco: Never  Vaping Use   Vaping status: Never Used  Substance Use Topics   Alcohol use: Not Currently    Comment: weekends   Drug use: No    ROS   Objective:   Vitals: BP (!) 167/116 (BP Location: Right Arm)   Pulse 87   Temp 99.1 F (37.3 C) (Oral)   Resp 20   SpO2 97%   Physical Exam Constitutional:      General: He is not in acute distress.    Appearance: Normal appearance. He is well-developed and normal weight. He is not ill-appearing, toxic-appearing or  diaphoretic.  HENT:     Head: Normocephalic and atraumatic.     Right Ear: External ear normal.     Left Ear: External ear normal.     Nose: Nose normal.     Mouth/Throat:     Pharynx: Oropharynx is clear.  Eyes:     General: No scleral icterus.       Right eye: No discharge.        Left eye: No discharge.     Extraocular Movements: Extraocular movements intact.  Cardiovascular:     Rate and Rhythm: Normal rate.  Pulmonary:     Effort: Pulmonary effort is normal.  Genitourinary:    Penis: Circumcised. Discharge present. No phimosis, paraphimosis, hypospadias, erythema, tenderness, swelling or lesions.   Musculoskeletal:     Cervical back: Normal range of motion.  Neurological:     Mental Status: He is alert and oriented to person, place, and time.  Psychiatric:        Mood and Affect: Mood normal.        Behavior: Behavior normal.        Thought Content: Thought content normal.        Judgment: Judgment normal.    IM ceftriaxone 500 mg administered in clinic.  Assessment and Plan :   PDMP not reviewed this encounter.  1. Urethritis  Patient treated empirically as per CDC guidelines with IM ceftriaxone, doxycycline as an outpatient.  Labs pending.   Counseled on safe sex practices including abstaining for 1 week following treatment.  Counseled patient on potential for adverse effects with medications prescribed/recommended today, ER and return-to-clinic precautions discussed, patient verbalized understanding.    Wallis Bamberg, PA-C 02/04/23 1436

## 2023-02-05 LAB — CYTOLOGY, (ORAL, ANAL, URETHRAL) ANCILLARY ONLY
Chlamydia: POSITIVE — AB
Comment: NEGATIVE
Comment: NEGATIVE
Comment: NORMAL
Neisseria Gonorrhea: NEGATIVE
Trichomonas: NEGATIVE

## 2023-02-05 LAB — HIV ANTIBODY (ROUTINE TESTING W REFLEX): HIV Screen 4th Generation wRfx: NONREACTIVE

## 2023-02-05 LAB — RPR: RPR Ser Ql: NONREACTIVE

## 2023-04-28 ENCOUNTER — Ambulatory Visit: Payer: Commercial Managed Care - PPO | Admitting: Family Medicine

## 2023-05-05 ENCOUNTER — Telehealth: Admitting: Family Medicine

## 2023-05-05 DIAGNOSIS — A6000 Herpesviral infection of urogenital system, unspecified: Secondary | ICD-10-CM | POA: Diagnosis not present

## 2023-05-05 MED ORDER — VALACYCLOVIR HCL 500 MG PO TABS: 500.0000 mg | ORAL_TABLET | Freq: Two times a day (BID) | ORAL | 0 refills | Status: AC

## 2023-05-05 NOTE — Progress Notes (Signed)
 E-Visit for Herpes Simplex  We are sorry that you are not feeling well.  Here is how we plan to help!  Based on what you have shared ith me, it looks like you may be having an outbreak/flare-up of genital herpes.    I have prescribed I have prescribed Valacyclovir 500 mg Take one by mouth twice a day for 3 days.    If you have been prescribed long term medications to be taken on a regular basis, it is important to follow the recommendations and take them as ordered.    Outbreaks usually include blisters and open sores in the genital area. Outbreaks that happen after the first time are usually not as severe and do not last as long. Genital Herpes Simplex is a commonly sexually transmitted viral infection that is found worldwide. Most of these genital infections are caused by one or two herpes simplex viruses that is passed from person to person during vaginal, oral, or anal sex. Sometimes, people do not know they have herpes because they do not have any symptoms.  Please be aware that if you have genital herpes you can be contagious even when you are not having rash or flare-up and you may not have any symptoms, even when you are taking suppressive medicines.  Herpes cannot be cured. The disease usually causes most problems during the first few years. After that, the virus is still there, but it causes few to no symptoms. Even when the virus is active, people with herpes can take medicines to reduce and help prevent symptoms.  Herpes is an infection that can cause blisters and open sores on the genital area. Herpes is caused by a virus that is passed from person to person during vaginal, oral, or anal sex. Sometimes, people do not know they have herpes because they do not have any symptoms. Herpes cannot be cured. The disease usually causes most problems during the first few years. After that, the virus is still there, but it causes few to no symptoms. Even when the virus is active, people with herpes  can take medicines to reduce and help prevent symptoms.  If you have been prescribed medications to be taken on a regular basis, it is important to follow the recommendations and take them as ordered.  Some people with herpes never have any symptoms. But other people can develop symptoms within a few weeks of being infected with the herpes virus   Symptoms usually include blisters in the genital area. In women, this area includes the vagina, buttocks, anus, or thighs. In men, this area includes the penis, scrotum, anus, butt, or thighs. The blisters can become painful open sores, which then crust over as they heal. Sometimes, people can have other symptoms that include:  ?Blisters on the mouth or lips ?Fever, headache, or pain in the joints ?Trouble urinating  Outbreaks might occur every month or more often, or just once or twice a year. Sometimes, people can tell when an outbreak will occur, because they feel itching or pain beforehand. Sometimes they do not know that an outbreak is coming because they have no symptoms. Whatever your pattern is, keep in mind that herpes outbreaks usually become less frequent over time as you get older. Certain things, called "triggers," can make outbreaks more likely to occur. These include stress, sunlight, menstrual periods,or getting sick.  Antiviral therapy can shorten the duration of symptoms and signs in primary infection, which, when untreated, can be associated with significant increase in the  symptoms of the disease.  HOME CARE Use a portable bath (such as a "Sitz bath") where you can sit in warm water for about 20 minutes. Your bathtub could also work. Avoid bubble baths.  Keep the genital area clean and dry and avoid tight clothes.  Take over-the-counter pain medicine such as acetaminophen (brand name: Tylenol) or ibuprofen sample brand names: Advil, Motrin). But avoid aspirin.  Only take medications as instructed by your medical team.  You are  most likely to spread herpes to a sex partner when you have blisters and open sores on your body. But it's also possible to spread herpes to your partner when you do not have any symptoms. That is because herpes can be present on your body without causing any symptoms, like blisters or pain.  Telling your sex partner that you have herpes can be hard. But it can help protect them, since there are ways to lower the risk of spreading the infection.   Using a condom every time you have sex  Not having sex when you have symptoms  Not having oral sex if you have blisters or open sores (in the genital area or around your mouth)  MAKE SURE YOU   Understand these instructions. Do not have sex without using a condom until you have been seen by a doctor and as instructed by the provider If you are not better or improved within 7 days, you MUST have a follow up at your doctor or the health department for evaluation. There are other causes of rashes in the genital region.  Thank you for choosing an e-visit.  Your e-visit answers were reviewed by a board certified advanced clinical practitioner to complete your personal care plan. Depending upon the condition, your plan could have included both over the counter or prescription medications.  Please review your pharmacy choice. Make sure the pharmacy is open so you can pick up prescription now. If there is a problem, you may contact your provider through [employer] and have the prescription routed to another pharmacy.  Your safety is important to [place]. If you have drug allergies check your prescription carefully.   For the next 24 hours you can use MyChart to ask questions about today's visit, request a non-urgent call back, or ask for a work or school excuse. You will get an email in the next two days asking about your experience. I hope that your e-visit has been valuable and will speed your recovery.   have provided 5 minutes of non face to face  time during this encounter for chart review and documentation.

## 2023-06-03 ENCOUNTER — Ambulatory Visit: Admitting: Family Medicine

## 2023-11-16 ENCOUNTER — Ambulatory Visit: Payer: Self-pay

## 2023-11-16 NOTE — Telephone Encounter (Signed)
 FYI Only or Action Required?: FYI only for provider.  Patient was last seen in primary care on not established.  Called Nurse Triage reporting Dysuria.  Symptoms began several weeks ago.  Interventions attempted: Nothing.  Symptoms are: unchanged.  Triage Disposition: See Physician Within 24 Hours - pt going to UC. Will call to schedule a later appt to establish care with [facility].  Patient/caregiver understands and will follow disposition?:  Reason for Disposition  Urinating more frequently than usual (i.e., frequency) OR new-onset of the feeling of an urgent need to urinate (i.e., urgency)  Answer Assessment - Initial Assessment Questions 1. SYMPTOM: What's the main symptom you're concerned about? (e.g., frequency, incontinence)     Increased frequency  2. ONSET:      1 1/2 weeks  3. PAIN: Is there any pain? If Yes, ask: How bad is it? (Scale: 1-10; mild, moderate, severe)     Denies  4. OTHER SYMPTOMS: Do you have any other symptoms? (e.g., blood in urine, fever, flank pain, pain with urination)     Denies  Protocols used: Urinary Symptoms-A-AH Copied from CRM #[id number]. Topic: Clinical - Red Word Triage >> Nov 16, 2023 11:22 AM Willma SAUNDERS wrote: [facility] that prompted transfer to Nurse Triage: Patient thinks he may have a UTI, increased frequency, having to go every hour. Wants to establish at [facility], parents see Dr Jarold.

## 2023-11-17 ENCOUNTER — Encounter (HOSPITAL_BASED_OUTPATIENT_CLINIC_OR_DEPARTMENT_OTHER): Payer: Self-pay

## 2023-11-17 ENCOUNTER — Emergency Department (HOSPITAL_BASED_OUTPATIENT_CLINIC_OR_DEPARTMENT_OTHER)
Admission: EM | Admit: 2023-11-17 | Discharge: 2023-11-17 | Disposition: A | Discharge status: Home / Self Care | Attending: Emergency Medicine | Admitting: Emergency Medicine

## 2023-11-17 ENCOUNTER — Ambulatory Visit
Admission: RE | Admit: 2023-11-17 | Discharge: 2023-11-17 | Disposition: A | Discharge status: Other Acute Inpatient Hospital | Attending: Family Medicine | Admitting: Family Medicine

## 2023-11-17 ENCOUNTER — Other Ambulatory Visit: Payer: Self-pay

## 2023-11-17 VITALS — BP 153/87 | HR 88 | Temp 97.8°F | Resp 17

## 2023-11-17 DIAGNOSIS — R7301 Impaired fasting glucose: Secondary | ICD-10-CM | POA: Diagnosis not present

## 2023-11-17 DIAGNOSIS — R350 Frequency of micturition: Secondary | ICD-10-CM | POA: Diagnosis not present

## 2023-11-17 DIAGNOSIS — E1110 Type 2 diabetes mellitus with ketoacidosis without coma: Secondary | ICD-10-CM | POA: Diagnosis not present

## 2023-11-17 DIAGNOSIS — E871 Hypo-osmolality and hyponatremia: Secondary | ICD-10-CM | POA: Diagnosis not present

## 2023-11-17 DIAGNOSIS — R3589 Other polyuria: Secondary | ICD-10-CM | POA: Diagnosis present

## 2023-11-17 LAB — POCT URINE DIPSTICK
Bilirubin, UA: NEGATIVE
Glucose, UA: >=1000 mg/dL — AB
Leukocytes, UA: NEGATIVE
Nitrite, UA: NEGATIVE
POC PROTEIN,UA: 100 — AB
Spec Grav, UA: 1.015 (ref 1.010–1.025)
Urobilinogen, UA: 0.2 U/dL
pH, UA: 5.5 (ref 5.0–8.0)

## 2023-11-17 LAB — BASIC METABOLIC PANEL WITH GFR
Anion gap: 18 — ABNORMAL HIGH (ref 5–15)
Anion gap: 14 (ref 5–15)
BUN: 25 mg/dL — ABNORMAL HIGH (ref 6–20)
BUN: 20 mg/dL (ref 6–20)
CO2: 20 mmol/L — ABNORMAL LOW (ref 22–32)
CO2: 20 mmol/L — ABNORMAL LOW (ref 22–32)
Calcium: 9.9 mg/dL (ref 8.9–10.3)
Calcium: 8.4 mg/dL — ABNORMAL LOW (ref 8.9–10.3)
Chloride: 95 mmol/L — ABNORMAL LOW (ref 98–111)
Chloride: 103 mmol/L (ref 98–111)
Creatinine, Ser: 1.45 mg/dL — ABNORMAL HIGH (ref 0.61–1.24)
Creatinine, Ser: 1.19 mg/dL (ref 0.61–1.24)
GFR, Estimated: >60 mL/min (ref >60)
GFR, Estimated: >60 mL/min (ref >60)
Glucose, Bld: 399 mg/dL — ABNORMAL HIGH (ref 70–99)
Glucose, Bld: 298 mg/dL — ABNORMAL HIGH (ref 70–99)
Potassium: 4.3 mmol/L (ref 3.5–5.1)
Potassium: 4.1 mmol/L (ref 3.5–5.1)
Sodium: 133 mmol/L — ABNORMAL LOW (ref 135–145)
Sodium: 137 mmol/L (ref 135–145)

## 2023-11-17 LAB — I-STAT VENOUS BLOOD GAS, ED
Acid-base deficit: 2 mmol/L (ref 0.0–2.0)
Bicarbonate: 24.6 mmol/L (ref 20.0–28.0)
Calcium, Ion: 1.24 mmol/L (ref 1.15–1.40)
HCT: 46 % (ref 39.0–52.0)
Hemoglobin: 15.6 g/dL (ref 13.0–17.0)
O2 Saturation: 52 %
Patient temperature: 98.2
Potassium: 4.5 mmol/L (ref 3.5–5.1)
Sodium: 134 mmol/L — ABNORMAL LOW (ref 135–145)
TCO2: 26 mmol/L (ref 22–32)
pCO2, Ven: 48 mmHg (ref 44–60)
pH, Ven: 7.317 (ref 7.25–7.43)
pO2, Ven: 30 mmHg — CL (ref 32–45)

## 2023-11-17 LAB — URINALYSIS, MICROSCOPIC (REFLEX)

## 2023-11-17 LAB — CBC
HCT: 46.4 % (ref 39.0–52.0)
Hemoglobin: 16.2 g/dL (ref 13.0–17.0)
MCH: 28.8 pg (ref 26.0–34.0)
MCHC: 34.9 g/dL (ref 30.0–36.0)
MCV: 82.4 fL (ref 80.0–100.0)
Platelets: 260 K/uL (ref 150–400)
RBC: 5.63 MIL/uL (ref 4.22–5.81)
RDW: 11.9 % (ref 11.5–15.5)
WBC: 9.2 K/uL (ref 4.0–10.5)
nRBC: 0 % (ref 0.0–0.2)

## 2023-11-17 LAB — URINALYSIS, ROUTINE W REFLEX MICROSCOPIC
Bilirubin Urine: NEGATIVE
Glucose, UA: >=500 mg/dL — AB
Ketones, ur: 80 mg/dL — AB
Leukocytes,Ua: NEGATIVE
Nitrite: NEGATIVE
Protein, ur: 100 mg/dL — AB
Specific Gravity, Urine: 1.02 (ref 1.005–1.030)
pH: 5.5 (ref 5.0–8.0)

## 2023-11-17 LAB — CBG MONITORING, ED
Glucose-Capillary: 393 mg/dL — ABNORMAL HIGH (ref 70–99)
Glucose-Capillary: 380 mg/dL — ABNORMAL HIGH (ref 70–99)
Glucose-Capillary: 297 mg/dL — ABNORMAL HIGH (ref 70–99)

## 2023-11-17 LAB — GLUCOSE, POCT (MANUAL RESULT ENTRY): POCT Glucose (KUC): 375 mg/dL — AB (ref 70–99)

## 2023-11-17 LAB — BETA-HYDROXYBUTYRIC ACID: Beta-Hydroxybutyric Acid: 2.36 mmol/L — ABNORMAL HIGH (ref 0.05–0.27)

## 2023-11-17 MED ORDER — METFORMIN HCL 500 MG PO TABS: 500.0000 mg | ORAL_TABLET | Freq: Two times a day (BID) | ORAL | 1 refills | Status: DC

## 2023-11-17 MED ORDER — LISINOPRIL 5 MG PO TABS: 5.0000 mg | ORAL_TABLET | Freq: Every day | ORAL | 1 refills | Status: DC

## 2023-11-17 MED ORDER — INSULIN ASPART 100 UNIT/ML IJ SOLN
10.0000 [IU] | Freq: Once | INTRAMUSCULAR | Status: AC
  Administered 2023-11-17: 10 [IU] via SUBCUTANEOUS

## 2023-11-17 MED ORDER — LACTATED RINGERS IV BOLUS
1000.0000 mL | Freq: Once | INTRAVENOUS | Status: AC
Start: 2023-11-17 — End: 2023-11-17
  Administered 2023-11-17: 1000 mL via INTRAVENOUS

## 2023-11-17 MED ORDER — LACTATED RINGERS IV BOLUS
1000.0000 mL | Freq: Once | INTRAVENOUS | Status: AC
  Administered 2023-11-17: 1000 mL via INTRAVENOUS

## 2023-11-17 NOTE — ED Triage Notes (Signed)
 Pt c/o frequent urinationx1.5wks

## 2023-11-17 NOTE — ED Provider Notes (Signed)
 [facility]    CSN: [id number] Arrival date & time: 11/17/23  0816      History   Chief Complaint Chief Complaint  Patient presents with   Urinary Frequency    Increased urination over the past week. Going at least once every hour. No pain at all - Entered by patient    HPI Joseph Bullock is a 38 y.o. male presents for urinary frequency.  Patient reports 1 of weeks of urinary frequency with urgency.  Denies dysuria, fevers, nausea/vomiting, flank pain, abdominal pain, hematuria.  No STD concern.  No penile discharge or testicular pain or swelling.  He denies history of diabetes and denies family history of diabetes or hypertension.  He does not have a PCP.  No other concerns at this time.   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   Prediabetes 03/06/2016   Prehypertension 03/06/2016   OSA on CPAP 02/19/2014   Microhematuria 03/09/2012   OSA (obstructive sleep apnea) 01/07/2012   BMI 35.0-35.9,adult 11/24/2011   Nicotine addiction 11/24/2011    Past Surgical History:  Procedure Laterality Date   BICEPS TENDON REPAIR Right 10/27/2018   Procedure: PECTORALIS RECONSTRUCTION WITH ALLOGRAFT;  Surgeon: Dozier Soulier, MD;  Location: [facility];  Service: Orthopedics;  Laterality: Right;  REQUEST 120 MINUTES   NASAL SEPTUM SURGERY  2018   TONSILLECTOMY AND ADENOIDECTOMY  1994       Home Medications    Prior to Admission medications   Medication Sig Start Date End Date Taking? Authorizing Provider  clotrimazole  (LOTRIMIN ) 1 % cream Apply to affected area 2 times daily 12/21/22   Maymuna Detzel, Jodi R, NP  doxycycline  (VIBRAMYCIN ) 100 MG capsule Take 1 capsule (100 mg total) by mouth 2 (two) times daily. 02/04/23   Christopher Savannah, PA-C  metroNIDAZOLE  (FLAGYL ) 500 MG  tablet Take 4 tablets once. 12/03/22   Flint Sonny POUR, PA-C    Family History History reviewed. No pertinent family history.  Social History Social History   Tobacco Use   Smoking status: Former    Current packs/day: 0.00    Types: Cigarettes    Quit date: 12/12/2009    Years since quitting: 13.9   Smokeless tobacco: Never  Vaping Use   Vaping status: Never Used  Substance Use Topics   Alcohol use: Yes    Alcohol/week: 3.0 standard drinks of alcohol    Types: 3 Cans of beer per week   Drug use: No     Allergies   Patient has no known allergies.   Review of Systems Review of Systems  Genitourinary:  Positive for frequency.     Physical Exam Triage Vital Signs ED Triage Vitals  Encounter Vitals Group     BP 11/17/23 0824 (!) 158/117     Girls Systolic BP Percentile --      Girls Diastolic BP Percentile --      Boys Systolic BP Percentile --      Boys Diastolic BP Percentile --      Pulse Rate 11/17/23 0824 88     Resp 11/17/23 0824 17     Temp 11/17/23 0824 97.8 F (36.6 C)     Temp Source 11/17/23 0824 Oral     SpO2 11/17/23  0824 95 %     Weight --      Height --      Head Circumference --      Peak Flow --      Pain Score 11/17/23 0823 0     Pain Loc --      Pain Education --      Exclude from Growth Chart --    No data found.  Updated Vital Signs BP (!) 153/87   Pulse 88   Temp 97.8 F (36.6 C) (Oral)   Resp 17   SpO2 95%   Visual Acuity Right Eye Distance:   Left Eye Distance:   Bilateral Distance:    Right Eye Near:   Left Eye Near:    Bilateral Near:     Physical Exam Vitals and nursing note reviewed.  Constitutional:      Appearance: Normal appearance.  HENT:     Head: Normocephalic and atraumatic.  Eyes:     Pupils: Pupils are equal, round, and reactive to light.  Cardiovascular:     Rate and Rhythm: Normal rate.  Pulmonary:     Effort: Pulmonary effort is normal.  Skin:    General: Skin is warm and dry.   Neurological:     General: No focal deficit present.     Mental Status: He is alert and oriented to person, place, and time.  Psychiatric:        Mood and Affect: Mood normal.        Behavior: Behavior normal.      UC Treatments / Results  Labs (all labs ordered are listed, but only abnormal results are displayed) Labs Reviewed  POCT URINE DIPSTICK - Abnormal; Notable for the following components:      Result Value   Glucose, UA >=1,000 (*)    Ketones, POC UA large (80) (*)    Blood, UA moderate (*)    POC PROTEIN,UA =100 (*)    All other components within normal limits  GLUCOSE, POCT (MANUAL RESULT ENTRY) - Abnormal; Notable for the following components:   POCT Glucose ([facility]) 375 (*)    All other components within normal limits    EKG   Radiology No results found.  Procedures Procedures (including critical care time)  Medications Ordered in UC Medications - No data to display  Initial Impression / Assessment and Plan / UC Course  I have reviewed the triage vital signs and the nursing notes.  Pertinent labs & imaging results that were available during my care of the patient were reviewed by me and considered in my medical decision making (see chart for details).     Reviewed exam and symptoms with patient.  UA negative for UTI but does show greater than 1000 glucose as well as large ketones and moderate blood.  Fingerstick blood sugar done and was 375.  Patient states he is fasting and has not eaten within the past 2 hours.  Discussed with patient his symptoms are likely secondary to undiagnosed diabetes.  Discussed case with Dr. Kriste, while patient was able to make a PCP appointment is not for another month.  Discussed ER evaluation/workup and he is in agreement plan will go POV to med [facility].  Blood pressure did improve on recheck. Final Clinical Impressions(s) / UC Diagnoses   Final diagnoses:  Urinary frequency  Elevated fasting glucose      Discharge Instructions      Please go to the emergency room for further workup  of your elevated blood sugar     ED Prescriptions   None    PDMP not reviewed this encounter.   Loreda Myla SAUNDERS, NP 11/17/23 [phone]

## 2023-11-17 NOTE — ED Triage Notes (Signed)
 Reports increased urinary frequency for 1 week. Denies dysuria, abd pain, NV  Sent by UC

## 2023-11-17 NOTE — Discharge Instructions (Addendum)
 Please go to the emergency room for further workup of your elevated blood sugar

## 2023-11-17 NOTE — Discharge Instructions (Signed)
 We are starting you on 2 new medications to treat your blood pressure and new onset diabetes.  It is important to drink plenty of fluids and to exercise, lose weight, and follow-up closely with a primary care provider.  If you develop new or worsening symptoms, please return to the ER.

## 2023-11-17 NOTE — ED Notes (Signed)
 Patient is being discharged from the Urgent Care and sent to the Emergency Department via POV . Per Myla Bold, patient is in need of higher level of care due to hyperglycemia and HTN. Patient is aware and verbalizes understanding of plan of care.  Vitals:   11/17/23 0824 11/17/23 0856  BP: (!) 158/117 (!) 153/87  Pulse: 88   Resp: 17   Temp: 97.8 F (36.6 C)   SpO2: 95%

## 2023-11-17 NOTE — ED Provider Notes (Signed)
 [place] [facility] Provider Note   CSN: [id number] Arrival date & time: 11/17/23  9068     Patient presents with: Hyperglycemia   Joseph Bullock is a 38 y.o. male.   HPI 38 year old male presents with polyuria.  Symptoms been ongoing for about a week and a half.  He went to urgent care where he was found to be hyperglycemic and hypertensive and sent to the ER for further evaluation.  He denies any significant past medical history.  He denies any pain such as headache or chest pain.  Prior to Admission medications   Medication Sig Start Date End Date Taking? Authorizing Provider  lisinopril  (ZESTRIL ) 5 MG tablet Take 1 tablet (5 mg total) by mouth daily. 11/17/23  Yes Freddi Hamilton, MD  metFORMIN  (GLUCOPHAGE ) 500 MG tablet Take 1 tablet (500 mg total) by mouth 2 (two) times daily with a meal. 11/17/23  Yes Freddi Hamilton, MD  clotrimazole  (LOTRIMIN ) 1 % cream Apply to affected area 2 times daily 12/21/22   Mayer, Jodi R, NP  doxycycline  (VIBRAMYCIN ) 100 MG capsule Take 1 capsule (100 mg total) by mouth 2 (two) times daily. 02/04/23   Christopher Savannah, PA-C  metroNIDAZOLE  (FLAGYL ) 500 MG tablet Take 4 tablets once. 12/03/22   Sofia, Leslie K, PA-C    Allergies: Patient has no known allergies.    Review of Systems  Respiratory:  Negative for shortness of breath.   Cardiovascular:  Negative for chest pain.  Endocrine: Positive for polyuria. Negative for polydipsia.  Genitourinary:  Negative for dysuria.  Neurological:  Negative for headaches.    Updated Vital Signs BP (!) 161/111   Pulse 85   Temp 98.2 F (36.8 C) (Oral)   Resp 16   SpO2 100%   Physical Exam Vitals and nursing note reviewed.  Constitutional:      General: He is not in acute distress.    Appearance: He is well-developed. He is not ill-appearing or diaphoretic.  HENT:     Head: Normocephalic and atraumatic.  Cardiovascular:     Rate and Rhythm: Normal rate and regular  rhythm.     Heart sounds: Normal heart sounds.  Pulmonary:     Effort: Pulmonary effort is normal.     Breath sounds: Normal breath sounds.  Abdominal:     Palpations: Abdomen is soft.     Tenderness: There is no abdominal tenderness.  Skin:    General: Skin is warm and dry.  Neurological:     Mental Status: He is alert.     (all labs ordered are listed, but only abnormal results are displayed) Labs Reviewed  BASIC METABOLIC PANEL WITH GFR - Abnormal; Notable for the following components:      Result Value   Sodium 133 (*)    Chloride 95 (*)    CO2 20 (*)    Glucose, Bld 399 (*)    BUN 25 (*)    Creatinine, Ser 1.45 (*)    Anion gap 18 (*)    All other components within normal limits  URINALYSIS, ROUTINE W REFLEX MICROSCOPIC - Abnormal; Notable for the following components:   Glucose, UA >=500 (*)    Hgb urine dipstick MODERATE (*)    Ketones, ur 80 (*)    Protein, ur 100 (*)    All other components within normal limits  URINALYSIS, MICROSCOPIC (REFLEX) - Abnormal; Notable for the following components:   Bacteria, UA RARE (*)    All other components  within normal limits  BETA-HYDROXYBUTYRIC ACID - Abnormal; Notable for the following components:   Beta-Hydroxybutyric Acid 2.36 (*)    All other components within normal limits  BASIC METABOLIC PANEL WITH GFR - Abnormal; Notable for the following components:   CO2 20 (*)    Glucose, Bld 298 (*)    Calcium  8.4 (*)    All other components within normal limits  CBG MONITORING, ED - Abnormal; Notable for the following components:   Glucose-Capillary 393 (*)    All other components within normal limits  I-STAT VENOUS BLOOD GAS, ED - Abnormal; Notable for the following components:   pO2, Ven 30 (*)    Sodium 134 (*)    All other components within normal limits  CBG MONITORING, ED - Abnormal; Notable for the following components:   Glucose-Capillary 380 (*)    All other components within normal limits  CBG MONITORING, ED -  Abnormal; Notable for the following components:   Glucose-Capillary 297 (*)    All other components within normal limits  CBC    EKG: None  Radiology: No results found.   Procedures   Medications Ordered in the ED  lactated ringers  bolus 1,000 mL (0 mLs Intravenous Stopped 11/17/23 1140)  lactated ringers  bolus 1,000 mL (0 mLs Intravenous Stopped 11/17/23 1140)  insulin  aspart (novoLOG ) injection 10 Units (10 Units Subcutaneous Given 11/17/23 1103)                                    Medical Decision Making Amount and/or Complexity of Data Reviewed External Data Reviewed: notes. Labs: ordered.    Details: Labs show hyperglycemia with a mild anion gap acidosis.  Risk Prescription drug management.   Patient appears to have new onset diabetes.  Technically he does seem to meet criteria for DKA though it is mild.  He was given fluids and subcutaneous insulin  after discussion with patient as he really does not want to come to the hospital.  I did recheck his BMP and there was a delay due to a lab analyzer problem but it seems like his anion gap is normal now although his bicarb does remain mildly low.  He still wants to go home which I think is reasonable.  Otherwise, we will start him on metformin  and due to his elevated blood pressures we will start him on lisinopril .  The urgent care that he went to this morning has already given him a PCP appointment with Rosina Senters.  Will discharge home with return precautions.     Final diagnoses:  Diabetic ketoacidosis without coma associated with type 2 diabetes mellitus [facility])    ED Discharge Orders          Ordered    lisinopril  (ZESTRIL ) 5 MG tablet  Daily        11/17/23 1450    metFORMIN  (GLUCOPHAGE ) 500 MG tablet  2 times daily with meals        11/17/23 1450               Freddi Hamilton, MD 11/17/23 1456

## 2023-12-23 ENCOUNTER — Ambulatory Visit: Admitting: Internal Medicine

## 2024-01-27 ENCOUNTER — Ambulatory Visit: Admitting: Internal Medicine

## 2024-02-02 ENCOUNTER — Ambulatory Visit: Admitting: Student

## 2024-02-02 VITALS — BP 126/78 | HR 103 | Ht 72.0 in | Wt 238.0 lb

## 2024-02-02 DIAGNOSIS — Z0000 Encounter for general adult medical examination without abnormal findings: Secondary | ICD-10-CM

## 2024-02-02 DIAGNOSIS — E119 Type 2 diabetes mellitus without complications: Secondary | ICD-10-CM | POA: Insufficient documentation

## 2024-02-02 DIAGNOSIS — Z23 Encounter for immunization: Secondary | ICD-10-CM

## 2024-02-02 DIAGNOSIS — G4733 Obstructive sleep apnea (adult) (pediatric): Secondary | ICD-10-CM

## 2024-02-02 DIAGNOSIS — Z7689 Persons encountering health services in other specified circumstances: Secondary | ICD-10-CM

## 2024-02-02 MED ORDER — BLOOD GLUCOSE MONITORING SUPPL DEVI: 1.0000 | 0 refills | Status: DC

## 2024-02-02 MED ORDER — BLOOD GLUCOSE TEST VI STRP: 1.0000 | ORAL_STRIP | 0 refills | Status: DC

## 2024-02-02 MED ORDER — LANCETS MISC: 1.0000 | 0 refills | Status: DC

## 2024-02-02 MED ORDER — LANCET DEVICE MISC: 1.0000 | 0 refills | Status: DC

## 2024-02-02 NOTE — Progress Notes (Signed)
 No chief complaint on file.      New Patient Visit SUBJECTIVE: HPI: Joseph Bullock is an 38 y.o.male who is being seen for establishing care. He is single, No children. Works full time as [employer], freight driving.  The patient has not had prior PCP.   He was was recently Dx with DM Type 2 at ED when hospitalized for DKA in September 2025. He takes metformin  1000 mg twice daily without side effects and has had significant weight loss, which he links to the medication and increased physical activity. Denies hypoglycemic events. He is not currently checking his blood sugar aat home.   He has Hx OSA, uses a CPAP machine nightly. He questions whether he still needs CPAP due to his recent weight loss and has not followed up with pulmonology in about 5 years. Previously seen by Dr. Jude at [facility] in [place].  He is single, has no children, and works full-time as a hospital doctor. He drinks alcohol once a week during football games and is a former smoker who quit 15-16 years ago after smoking for about 15 years.  Patient denies fever, chills, SOB, CP, palpitations, dyspnea, edema, HA, vision changes, N/V/D, abdominal pain, urinary symptoms, rash, weight changes, and recent illness or hospitalizations.      Past Medical History:  Diagnosis Date   Diabetes mellitus without complication (HCC)    Hypertension    Sleep apnea 2016   used CPAP for 2 years- have not used CPAP in 2 years   Past Surgical History:  Procedure Laterality Date   BICEPS TENDON REPAIR Right 10/27/2018   Procedure: PECTORALIS RECONSTRUCTION WITH ALLOGRAFT;  Surgeon: Dozier Soulier, MD;  Location: [facility];  Service: Orthopedics;  Laterality: Right;  REQUEST 120 MINUTES   NASAL SEPTUM SURGERY  2018   TONSILLECTOMY AND ADENOIDECTOMY  1994   Family History  Problem Relation Age of Onset   Prostate cancer Neg Hx    Colon cancer Neg Hx    No Known Allergies  Current Outpatient Medications:    Blood Glucose Monitoring Suppl  DEVI, 1 each by Does not apply route as directed. Dispense based on patient and insurance preference. Use up to four times daily as directed. (FOR ICD-10 E10.9, E11.9)., Disp: 1 each, Rfl: 0   Glucose Blood (BLOOD GLUCOSE TEST STRIPS) STRP, 1 each by Does not apply route as directed. Dispense based on patient and insurance preference. Use up to four times daily as directed. (FOR ICD-10 E10.9, E11.9)., Disp: 100 strip, Rfl: 0   Lancet Device MISC, 1 each by Does not apply route as directed. Dispense based on patient and insurance preference. Use up to four times daily as directed. (FOR ICD-10 E10.9, E11.9)., Disp: 1 each, Rfl: 0   Lancets MISC, 1 each by Does not apply route as directed. Dispense based on patient and insurance preference. Use up to four times daily as directed. (FOR ICD-10 E10.9, E11.9)., Disp: 100 each, Rfl: 0   lisinopril  (ZESTRIL ) 5 MG tablet, Take 1 tablet (5 mg total) by mouth daily., Disp: 30 tablet, Rfl: 1   metFORMIN  (GLUCOPHAGE ) 500 MG tablet, Take 1 tablet (500 mg total) by mouth 2 (two) times daily with a meal., Disp: 60 tablet, Rfl: 1  OBJECTIVE: BP 126/78   Pulse (!) 103   Ht 6' (1.829 m)   Wt 238 lb (108 kg)   SpO2 97%   BMI 32.28 kg/m  General:  well developed, well nourished, in no apparent distress Skin:  no significant moles, warts, or growths Nose:  nares patent, septum midline, mucosa normal Throat/Pharynx:  lips and gingiva without lesion; tongue and uvula midline; non-inflamed pharynx; no exudates or postnasal drainage Lungs:  clear to auscultation, breath sounds equal bilaterally, no respiratory distress Cardio:  regular rate and rhythm, no LE edema or bruits Musculoskeletal:  symmetrical muscle groups noted without atrophy or deformity Neuro:  gait normal Psych: well oriented with normal range of affect and appropriate judgment/insight  Wt Readings from Last 3 Encounters:  02/02/24 238 lb (108 kg)  03/21/19 276 lb (125.2 kg)  01/31/19 283 lb (128.4  kg)     ASSESSMENT/PLAN: Type 2 diabetes mellitus without complication, without long-term current use of insulin  (HCC) - Plan: Lipid panel, CBC with Differential/Platelet, Comprehensive metabolic panel with GFR, Hemoglobin A1c, Microalbumin / creatinine urine ratio, Ambulatory referral to Ophthalmology  Encounter to establish care  OSA (obstructive sleep apnea) - Plan: Ambulatory referral to Pulmonology  Preventative health care - Plan: Hepatitis B surface antibody,quantitative  Need for Tdap vaccination - Plan: Tdap vaccine greater than or equal to 7yo IM   Type 2 diabetes mellitus Recently diagnosed, likely due to insulin  resistance. On metformin  1000 mg BID. Awaiting A1c results. Discussed potential addition of GLP-1 receptor agonist if A1c elevated. Emphasized maintaining A1c <7% to prevent complications. - Ordered A1c test,UACR, lipid panel - Ordered glucose machine for home monitoring. - Refer to ophthalmology for diabetic eye exam. - Will consider starting Mounjaro based on A1c, Pt is agreeable to this. Medication and common side effects reviewed with the patient; patient voiced understanding and had no further questions at this time.  -Follow-up in 3 months .  Obstructive sleep apnea Using CPAP nightly but uncertian if needs to continue CPAP r/t recent 40+ lb weight loss. - Referred to [facility] for evaluation of CPAP necessity.  General Health Maintenance Due for second dose of tetanus vaccine. No recent cholesterol screening. - Administered tetanus vaccine. - Ordered lipid panel.  Patient instructed to sign release of records form from their previous PCP. Patient should return in 3 months for CPE The patient voiced understanding and agreement to the plan.   Harlene LITTIE Jolly, DNP, AGNP-C 02/02/24

## 2024-02-03 LAB — CBC WITH DIFFERENTIAL/PLATELET
Basophils Absolute: 0.1 K/uL (ref 0.0–0.1)
Basophils Relative: 0.9 % (ref 0.0–3.0)
Eosinophils Absolute: 0 K/uL (ref 0.0–0.7)
Eosinophils Relative: 0.4 % (ref 0.0–5.0)
HCT: 47.2 % (ref 39.0–52.0)
Hemoglobin: 15.5 g/dL (ref 13.0–17.0)
Lymphocytes Relative: 21 % (ref 12.0–46.0)
Lymphs Abs: 2.2 K/uL (ref 0.7–4.0)
MCHC: 32.8 g/dL (ref 30.0–36.0)
MCV: 86.4 fl (ref 78.0–100.0)
Monocytes Absolute: 0.9 K/uL (ref 0.1–1.0)
Monocytes Relative: 8.3 % (ref 3.0–12.0)
Neutro Abs: 7.1 K/uL (ref 1.4–7.7)
Neutrophils Relative %: 69.4 % (ref 43.0–77.0)
Platelets: 275 K/uL (ref 150.0–400.0)
RBC: 5.46 Mil/uL (ref 4.22–5.81)
RDW: 13.3 % (ref 11.5–15.5)
WBC: 10.3 K/uL (ref 4.0–10.5)

## 2024-02-03 LAB — HEPATITIS B SURFACE ANTIBODY, QUANTITATIVE: Hep B S AB Quant (Post): 6 m[IU]/mL — ABNORMAL LOW (ref >=10)

## 2024-02-03 LAB — LIPID PANEL
Cholesterol: 164 mg/dL (ref 0–200)
HDL: 33.4 mg/dL — ABNORMAL LOW (ref >39.00)
LDL Cholesterol: 63 mg/dL (ref 0–99)
NonHDL: 130.11
Total CHOL/HDL Ratio: 5
Triglycerides: 335 mg/dL — ABNORMAL HIGH (ref 0.0–149.0)
VLDL: 67 mg/dL — ABNORMAL HIGH (ref 0.0–40.0)

## 2024-02-03 LAB — COMPREHENSIVE METABOLIC PANEL WITH GFR
ALT: 14 U/L (ref 0–53)
AST: 11 U/L (ref 0–37)
Albumin: 4.7 g/dL (ref 3.5–5.2)
Alkaline Phosphatase: 83 U/L (ref 39–117)
BUN: 21 mg/dL (ref 6–23)
CO2: 19 meq/L (ref 19–32)
Calcium: 9.9 mg/dL (ref 8.4–10.5)
Chloride: 96 meq/L (ref 96–112)
Creatinine, Ser: 1.64 mg/dL — ABNORMAL HIGH (ref 0.40–1.50)
GFR: 53.15 mL/min — ABNORMAL LOW (ref >60.00)
Glucose, Bld: 419 mg/dL — ABNORMAL HIGH (ref 70–99)
Potassium: 4.9 meq/L (ref 3.5–5.1)
Sodium: 132 meq/L — ABNORMAL LOW (ref 135–145)
Total Bilirubin: 0.7 mg/dL (ref 0.2–1.2)
Total Protein: 7.9 g/dL (ref 6.0–8.3)

## 2024-02-03 LAB — MICROALBUMIN / CREATININE URINE RATIO
Creatinine,U: 69.8 mg/dL
Microalb Creat Ratio: 245.8 mg/g — ABNORMAL HIGH (ref 0.0–30.0)
Microalb, Ur: 17.2 mg/dL — ABNORMAL HIGH (ref 0.0–1.9)

## 2024-02-03 LAB — HEMOGLOBIN A1C: Hgb A1c MFr Bld: 15 % — ABNORMAL HIGH (ref 4.6–6.5)

## 2024-02-04 ENCOUNTER — Encounter: Payer: Self-pay | Admitting: Student

## 2024-02-04 ENCOUNTER — Ambulatory Visit: Payer: Self-pay | Admitting: Student

## 2024-02-04 DIAGNOSIS — R7309 Other abnormal glucose: Secondary | ICD-10-CM

## 2024-02-04 DIAGNOSIS — R809 Proteinuria, unspecified: Secondary | ICD-10-CM

## 2024-02-04 MED ORDER — INSULIN GLARGINE 100 UNIT/ML SOLOSTAR PEN: 15.0000 [IU] | PEN_INJECTOR | Freq: Every day | SUBCUTANEOUS | 11 refills | Status: AC

## 2024-02-07 NOTE — Telephone Encounter (Signed)
 Copied from CRM #[id number]. 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

## 2024-02-09 ENCOUNTER — Telehealth: Payer: Self-pay

## 2024-02-09 NOTE — Progress Notes (Signed)
 Complex Care Management Note Care Guide Note  02/09/2024 Name: Joseph Bullock MRN: [id number] DOB: [birth year]   Complex Care Management Outreach Attempts: An unsuccessful telephone outreach was attempted today to offer the patient information about available complex care management services.  Follow Up Plan:  Additional outreach attempts will be made to offer the patient complex care management information and services.   Encounter Outcome:  No Answer  Dreama [facility]  [facility] VBCI Assistant Direct Dial: [phone]  Fax: [phone]

## 2024-02-10 ENCOUNTER — Encounter (HOSPITAL_COMMUNITY): Payer: Self-pay | Admitting: Emergency Medicine

## 2024-02-10 ENCOUNTER — Other Ambulatory Visit: Payer: Self-pay

## 2024-02-10 ENCOUNTER — Other Ambulatory Visit (HOSPITAL_COMMUNITY): Payer: Self-pay

## 2024-02-10 ENCOUNTER — Inpatient Hospital Stay (HOSPITAL_COMMUNITY)
Admission: EM | Admit: 2024-02-10 | Discharge: 2024-02-11 | DRG: 638 | Disposition: A | Discharge status: Home / Self Care | Attending: Internal Medicine | Admitting: Internal Medicine

## 2024-02-10 ENCOUNTER — Telehealth (HOSPITAL_COMMUNITY): Payer: Self-pay | Admitting: Pharmacy Technician

## 2024-02-10 DIAGNOSIS — G4733 Obstructive sleep apnea (adult) (pediatric): Secondary | ICD-10-CM | POA: Diagnosis present

## 2024-02-10 DIAGNOSIS — I10 Essential (primary) hypertension: Secondary | ICD-10-CM | POA: Diagnosis present

## 2024-02-10 DIAGNOSIS — Z6832 Body mass index (BMI) 32.0-32.9, adult: Secondary | ICD-10-CM | POA: Diagnosis not present

## 2024-02-10 DIAGNOSIS — Z1152 Encounter for screening for COVID-19: Secondary | ICD-10-CM | POA: Diagnosis not present

## 2024-02-10 DIAGNOSIS — Z7984 Long term (current) use of oral hypoglycemic drugs: Secondary | ICD-10-CM

## 2024-02-10 DIAGNOSIS — D72829 Elevated white blood cell count, unspecified: Secondary | ICD-10-CM | POA: Diagnosis present

## 2024-02-10 DIAGNOSIS — Z9889 Other specified postprocedural states: Secondary | ICD-10-CM | POA: Diagnosis not present

## 2024-02-10 DIAGNOSIS — E1110 Type 2 diabetes mellitus with ketoacidosis without coma: Secondary | ICD-10-CM | POA: Diagnosis present

## 2024-02-10 DIAGNOSIS — E669 Obesity, unspecified: Secondary | ICD-10-CM | POA: Diagnosis present

## 2024-02-10 DIAGNOSIS — Z87891 Personal history of nicotine dependence: Secondary | ICD-10-CM

## 2024-02-10 DIAGNOSIS — N179 Acute kidney failure, unspecified: Secondary | ICD-10-CM | POA: Diagnosis present

## 2024-02-10 DIAGNOSIS — Z794 Long term (current) use of insulin: Secondary | ICD-10-CM

## 2024-02-10 DIAGNOSIS — Z9089 Acquired absence of other organs: Secondary | ICD-10-CM

## 2024-02-10 DIAGNOSIS — Z79899 Other long term (current) drug therapy: Secondary | ICD-10-CM

## 2024-02-10 DIAGNOSIS — R7309 Other abnormal glucose: Secondary | ICD-10-CM

## 2024-02-10 DIAGNOSIS — Z91199 Patient's noncompliance with other medical treatment and regimen due to unspecified reason: Secondary | ICD-10-CM

## 2024-02-10 DIAGNOSIS — E119 Type 2 diabetes mellitus without complications: Secondary | ICD-10-CM

## 2024-02-10 LAB — BASIC METABOLIC PANEL WITH GFR
Anion gap: 22 — ABNORMAL HIGH (ref 5–15)
Anion gap: 17 — ABNORMAL HIGH (ref 5–15)
Anion gap: 16 — ABNORMAL HIGH (ref 5–15)
Anion gap: 14 (ref 5–15)
Anion gap: 12 (ref 5–15)
BUN: 19 mg/dL (ref 6–20)
BUN: 18 mg/dL (ref 6–20)
BUN: 15 mg/dL (ref 6–20)
BUN: 20 mg/dL (ref 6–20)
BUN: 19 mg/dL (ref 6–20)
CO2: 11 mmol/L — ABNORMAL LOW (ref 22–32)
CO2: 12 mmol/L — ABNORMAL LOW (ref 22–32)
CO2: 14 mmol/L — ABNORMAL LOW (ref 22–32)
CO2: 15 mmol/L — ABNORMAL LOW (ref 22–32)
CO2: 16 mmol/L — ABNORMAL LOW (ref 22–32)
Calcium: 9.1 mg/dL (ref 8.9–10.3)
Calcium: 9.1 mg/dL (ref 8.9–10.3)
Calcium: 9.2 mg/dL (ref 8.9–10.3)
Calcium: 9.2 mg/dL (ref 8.9–10.3)
Calcium: 9.5 mg/dL (ref 8.9–10.3)
Chloride: 100 mmol/L (ref 98–111)
Chloride: 101 mmol/L (ref 98–111)
Chloride: 101 mmol/L (ref 98–111)
Chloride: 102 mmol/L (ref 98–111)
Chloride: 105 mmol/L (ref 98–111)
Creatinine, Ser: 1.51 mg/dL — ABNORMAL HIGH (ref 0.61–1.24)
Creatinine, Ser: 1.34 mg/dL — ABNORMAL HIGH (ref 0.61–1.24)
Creatinine, Ser: 1.28 mg/dL — ABNORMAL HIGH (ref 0.61–1.24)
Creatinine, Ser: 1.55 mg/dL — ABNORMAL HIGH (ref 0.61–1.24)
Creatinine, Ser: 1.35 mg/dL — ABNORMAL HIGH (ref 0.61–1.24)
GFR, Estimated: >60 mL/min (ref >60)
GFR, Estimated: >60 mL/min (ref >60)
GFR, Estimated: >60 mL/min (ref >60)
GFR, Estimated: 59 mL/min — ABNORMAL LOW (ref >60)
GFR, Estimated: >60 mL/min (ref >60)
Glucose, Bld: 249 mg/dL — ABNORMAL HIGH (ref 70–99)
Glucose, Bld: 215 mg/dL — ABNORMAL HIGH (ref 70–99)
Glucose, Bld: 270 mg/dL — ABNORMAL HIGH (ref 70–99)
Glucose, Bld: 295 mg/dL — ABNORMAL HIGH (ref 70–99)
Glucose, Bld: 211 mg/dL — ABNORMAL HIGH (ref 70–99)
Potassium: 4 mmol/L (ref 3.5–5.1)
Potassium: 3.5 mmol/L (ref 3.5–5.1)
Potassium: 3.5 mmol/L (ref 3.5–5.1)
Potassium: 3.7 mmol/L (ref 3.5–5.1)
Potassium: 4 mmol/L (ref 3.5–5.1)
Sodium: 133 mmol/L — ABNORMAL LOW (ref 135–145)
Sodium: 130 mmol/L — ABNORMAL LOW (ref 135–145)
Sodium: 131 mmol/L — ABNORMAL LOW (ref 135–145)
Sodium: 131 mmol/L — ABNORMAL LOW (ref 135–145)
Sodium: 132 mmol/L — ABNORMAL LOW (ref 135–145)

## 2024-02-10 LAB — COMPREHENSIVE METABOLIC PANEL WITH GFR
ALT: 16 U/L (ref 0–44)
AST: 16 U/L (ref 15–41)
Albumin: 4.4 g/dL (ref 3.5–5.0)
Alkaline Phosphatase: 80 U/L (ref 38–126)
Anion gap: 25 — ABNORMAL HIGH (ref 5–15)
BUN: 22 mg/dL — ABNORMAL HIGH (ref 6–20)
CO2: 10 mmol/L — ABNORMAL LOW (ref 22–32)
Calcium: 9.2 mg/dL (ref 8.9–10.3)
Chloride: 96 mmol/L — ABNORMAL LOW (ref 98–111)
Creatinine, Ser: 1.66 mg/dL — ABNORMAL HIGH (ref 0.61–1.24)
GFR, Estimated: 54 mL/min — ABNORMAL LOW (ref >60)
Glucose, Bld: 363 mg/dL — ABNORMAL HIGH (ref 70–99)
Potassium: 4.5 mmol/L (ref 3.5–5.1)
Sodium: 130 mmol/L — ABNORMAL LOW (ref 135–145)
Total Bilirubin: 0.4 mg/dL (ref 0.0–1.2)
Total Protein: 8.2 g/dL — ABNORMAL HIGH (ref 6.5–8.1)

## 2024-02-10 LAB — CBC WITH DIFFERENTIAL/PLATELET
Abs Immature Granulocytes: 0.06 K/uL (ref 0.00–0.07)
Basophils Absolute: 0 K/uL (ref 0.0–0.1)
Basophils Relative: 0 %
Eosinophils Absolute: 0 K/uL (ref 0.0–0.5)
Eosinophils Relative: 0 %
HCT: 48.9 % (ref 39.0–52.0)
Hemoglobin: 16.3 g/dL (ref 13.0–17.0)
Immature Granulocytes: 0 %
Lymphocytes Relative: 15 %
Lymphs Abs: 2.3 K/uL (ref 0.7–4.0)
MCH: 28.6 pg (ref 26.0–34.0)
MCHC: 33.3 g/dL (ref 30.0–36.0)
MCV: 85.9 fL (ref 80.0–100.0)
Monocytes Absolute: 0.8 K/uL (ref 0.1–1.0)
Monocytes Relative: 5 %
Neutro Abs: 12.2 K/uL — ABNORMAL HIGH (ref 1.7–7.7)
Neutrophils Relative %: 80 %
Platelets: 261 K/uL (ref 150–400)
RBC: 5.69 MIL/uL (ref 4.22–5.81)
RDW: 13.2 % (ref 11.5–15.5)
WBC: 15.4 K/uL — ABNORMAL HIGH (ref 4.0–10.5)
nRBC: 0 % (ref 0.0–0.2)

## 2024-02-10 LAB — URINALYSIS, ROUTINE W REFLEX MICROSCOPIC
Bacteria, UA: NONE SEEN
Bilirubin Urine: NEGATIVE
Glucose, UA: >=500 mg/dL — AB
Ketones, ur: 80 mg/dL — AB
Leukocytes,Ua: NEGATIVE
Nitrite: NEGATIVE
Protein, ur: 100 mg/dL — AB
Specific Gravity, Urine: 1.02 (ref 1.005–1.030)
pH: 5 (ref 5.0–8.0)

## 2024-02-10 LAB — GLUCOSE, CAPILLARY
Glucose-Capillary: 177 mg/dL — ABNORMAL HIGH (ref 70–99)
Glucose-Capillary: 177 mg/dL — ABNORMAL HIGH (ref 70–99)
Glucose-Capillary: 180 mg/dL — ABNORMAL HIGH (ref 70–99)
Glucose-Capillary: 193 mg/dL — ABNORMAL HIGH (ref 70–99)
Glucose-Capillary: 218 mg/dL — ABNORMAL HIGH (ref 70–99)
Glucose-Capillary: 232 mg/dL — ABNORMAL HIGH (ref 70–99)
Glucose-Capillary: 296 mg/dL — ABNORMAL HIGH (ref 70–99)
Glucose-Capillary: 301 mg/dL — ABNORMAL HIGH (ref 70–99)
Glucose-Capillary: 305 mg/dL — ABNORMAL HIGH (ref 70–99)

## 2024-02-10 LAB — BLOOD GAS, VENOUS
Acid-base deficit: 19.7 mmol/L — ABNORMAL HIGH (ref 0.0–2.0)
Bicarbonate: 10 mmol/L — ABNORMAL LOW (ref 20.0–28.0)
O2 Saturation: 62.5 %
Patient temperature: 37
pCO2, Ven: 36 mmHg — ABNORMAL LOW (ref 44–60)
pH, Ven: 7.05 — CL (ref 7.25–7.43)
pO2, Ven: 36 mmHg (ref 32–45)

## 2024-02-10 LAB — CBG MONITORING, ED
Glucose-Capillary: 369 mg/dL — ABNORMAL HIGH (ref 70–99)
Glucose-Capillary: 382 mg/dL — ABNORMAL HIGH (ref 70–99)
Glucose-Capillary: 327 mg/dL — ABNORMAL HIGH (ref 70–99)
Glucose-Capillary: 270 mg/dL — ABNORMAL HIGH (ref 70–99)
Glucose-Capillary: 224 mg/dL — ABNORMAL HIGH (ref 70–99)
Glucose-Capillary: 215 mg/dL — ABNORMAL HIGH (ref 70–99)
Glucose-Capillary: 207 mg/dL — ABNORMAL HIGH (ref 70–99)
Glucose-Capillary: 200 mg/dL — ABNORMAL HIGH (ref 70–99)
Glucose-Capillary: 223 mg/dL — ABNORMAL HIGH (ref 70–99)
Glucose-Capillary: 221 mg/dL — ABNORMAL HIGH (ref 70–99)
Glucose-Capillary: 213 mg/dL — ABNORMAL HIGH (ref 70–99)

## 2024-02-10 LAB — RESP PANEL BY RT-PCR (RSV, FLU A&B, COVID)  RVPGX2
Influenza A by PCR: NEGATIVE
Influenza B by PCR: NEGATIVE
Resp Syncytial Virus by PCR: NEGATIVE
SARS Coronavirus 2 by RT PCR: NEGATIVE

## 2024-02-10 LAB — HIV ANTIBODY (ROUTINE TESTING W REFLEX): HIV Screen 4th Generation wRfx: NONREACTIVE

## 2024-02-10 LAB — BETA-HYDROXYBUTYRIC ACID
Beta-Hydroxybutyric Acid: >8 mmol/L — ABNORMAL HIGH (ref 0.05–0.27)
Beta-Hydroxybutyric Acid: 1.79 mmol/L — ABNORMAL HIGH (ref 0.05–0.27)

## 2024-02-10 MED ORDER — INSULIN REGULAR(HUMAN) IN NACL 100-0.9 UT/100ML-% IV SOLN
INTRAVENOUS | Status: DC
  Administered 2024-02-10: 20:00:00 12 [IU]/h via INTRAVENOUS
  Administered 2024-02-10: 03:00:00 15 [IU]/h via INTRAVENOUS
  Filled 2024-02-10 (×3): qty 100

## 2024-02-10 MED ORDER — DEXTROSE IN LACTATED RINGERS 5 % IV SOLN: INTRAVENOUS | Status: AC

## 2024-02-10 MED ORDER — POTASSIUM CHLORIDE 10 MEQ/100ML IV SOLN
10.0000 meq | INTRAVENOUS | Status: AC
  Administered 2024-02-10 (×2): 10 meq via INTRAVENOUS
  Filled 2024-02-10 (×2): qty 100

## 2024-02-10 MED ORDER — POTASSIUM CHLORIDE CRYS ER 20 MEQ PO TBCR
40.0000 meq | EXTENDED_RELEASE_TABLET | ORAL | Status: AC
  Administered 2024-02-10 (×2): 40 meq via ORAL
  Filled 2024-02-10 (×2): qty 2

## 2024-02-10 MED ORDER — ONDANSETRON HCL 4 MG/2ML IJ SOLN: 4.0000 mg | Freq: Four times a day (QID) | INTRAMUSCULAR | Status: DC | PRN

## 2024-02-10 MED ORDER — ORAL CARE MOUTH RINSE: 15.0000 mL | OROMUCOSAL | Status: DC | PRN

## 2024-02-10 MED ORDER — CHLORHEXIDINE GLUCONATE CLOTH 2 % EX PADS
6.0000 | MEDICATED_PAD | Freq: Every day | CUTANEOUS | Status: DC
  Administered 2024-02-11: 6 via TOPICAL

## 2024-02-10 MED ORDER — LACTATED RINGERS IV SOLN: INTRAVENOUS | Status: DC

## 2024-02-10 MED ORDER — LACTATED RINGERS IV BOLUS
20.0000 mL/kg | Freq: Once | INTRAVENOUS | Status: AC
  Administered 2024-02-10: 03:00:00 2160 mL via INTRAVENOUS

## 2024-02-10 MED ORDER — ENOXAPARIN SODIUM 40 MG/0.4ML IJ SOSY
40.0000 mg | PREFILLED_SYRINGE | INTRAMUSCULAR | Status: DC
  Administered 2024-02-10 – 2024-02-11 (×2): 40 mg via SUBCUTANEOUS
  Filled 2024-02-10 (×2): qty 0.4

## 2024-02-10 MED ORDER — ACETAMINOPHEN 325 MG PO TABS
650.0000 mg | ORAL_TABLET | Freq: Four times a day (QID) | ORAL | Status: DC | PRN
  Administered 2024-02-10 – 2024-02-11 (×2): 650 mg via ORAL
  Filled 2024-02-10 (×2): qty 2

## 2024-02-10 MED ORDER — DEXTROSE 50 % IV SOLN: 0.0000 mL | INTRAVENOUS | Status: DC | PRN

## 2024-02-10 MED ADMIN — Potassium Chloride Microencapsulated Crys ER Tab 20 mEq: 40 meq | ORAL | @ 12:00:00 | NDC 00245531911

## 2024-02-10 MED FILL — Potassium Chloride Microencapsulated Crys ER Tab 20 mEq: 40.0000 meq | ORAL | Qty: 2 | Status: AC

## 2024-02-10 NOTE — ED Provider Notes (Signed)
 [place] EMERGENCY DEPARTMENT AT [facility] Provider Note   CSN: [id number] Arrival date & time: 02/10/24  9956     Patient presents with: Hyperglycemia   Joseph Bullock is a 38 y.o. male with history of sleep apnea, diabetes, hypertension.  Presents to ED complaining of elevated blood glucose, daytime somnolence, fatigue.  Reports that he was recently diagnosed with diabetes in September and has been taking metformin  up until last week where he was also put on Lantus .  Reports that he did not take first Lantus  dose until Wednesday night as he did not have supplies.  Reports that over the last week he has had increased daytime somnolence, just feeling ill.  Reports that he has cough, bodyaches and chills.  Denies any fevers at home.  Reports that he has not been using CPAP over the last 1 week and he is not sure if this might be contributing to his daytime somnolence.  He denies any nausea, vomiting, abdominal pain, chest pain.  He does endorse shortness of breath.   Hyperglycemia      Prior to Admission medications  Medication Sig Start Date End Date Taking? Authorizing Provider  insulin  glargine (LANTUS ) 100 UNIT/ML Solostar Pen Inject 15 Units into the skin at bedtime. 02/04/24  Yes Yacopino, Jessica L, NP  metFORMIN  (GLUCOPHAGE ) 500 MG tablet Take 1 tablet (500 mg total) by mouth 2 (two) times daily with a meal. 11/17/23  Yes Freddi Hamilton, MD  Blood Glucose Monitoring Suppl DEVI 1 each by Does not apply route as directed. Dispense based on patient and insurance preference. Use up to four times daily as directed. (FOR ICD-10 E10.9, E11.9). 02/02/24   Wheeler Harlene CROME, NP  Glucose Blood (BLOOD GLUCOSE TEST STRIPS) STRP 1 each by Does not apply route as directed. Dispense based on patient and insurance preference. Use up to four times daily as directed. (FOR ICD-10 E10.9, E11.9). 02/02/24   Wheeler Harlene CROME, NP  Lancet Device MISC 1 each by Does not apply route as  directed. Dispense based on patient and insurance preference. Use up to four times daily as directed. (FOR ICD-10 E10.9, E11.9). 02/02/24   Wheeler Harlene CROME, NP  Lancets MISC 1 each by Does not apply route as directed. Dispense based on patient and insurance preference. Use up to four times daily as directed. (FOR ICD-10 E10.9, E11.9). 02/02/24   Wheeler Harlene CROME, NP  lisinopril  (ZESTRIL ) 5 MG tablet Take 1 tablet (5 mg total) by mouth daily. Patient not taking: Reported on 02/10/2024 11/17/23   Freddi Hamilton, MD    Allergies: Patient has no known allergies.    Review of Systems  Updated Vital Signs BP (!) 149/97   Pulse 89   Temp 98 F (36.7 C) (Oral)   Resp 15   Ht 6' (1.829 m)   Wt 108 kg   SpO2 98%   BMI 32.28 kg/m   Physical Exam  (all labs ordered are listed, but only abnormal results are displayed) Labs Reviewed  CBC WITH DIFFERENTIAL/PLATELET - Abnormal; Notable for the following components:      Result Value   WBC 15.4 (*)    Neutro Abs 12.2 (*)    All other components within normal limits  COMPREHENSIVE METABOLIC PANEL WITH GFR - Abnormal; Notable for the following components:   Sodium 130 (*)    Chloride 96 (*)    CO2 10 (*)    Glucose, Bld 363 (*)    BUN 22 (*)  Creatinine, Ser 1.66 (*)    Total Protein 8.2 (*)    GFR, Estimated 54 (*)    Anion gap 25 (*)    All other components within normal limits  URINALYSIS, ROUTINE W REFLEX MICROSCOPIC - Abnormal; Notable for the following components:   Color, Urine STRAW (*)    Glucose, UA >=500 (*)    Hgb urine dipstick MODERATE (*)    Ketones, ur 80 (*)    Protein, ur 100 (*)    All other components within normal limits  BETA-HYDROXYBUTYRIC ACID - Abnormal; Notable for the following components:   Beta-Hydroxybutyric Acid >8.00 (*)    All other components within normal limits  BLOOD GAS, VENOUS - Abnormal; Notable for the following components:   pH, Ven 7.05 (*)    pCO2, Ven 36 (*)    Bicarbonate 10.0  (*)    Acid-base deficit 19.7 (*)    All other components within normal limits  CBG MONITORING, ED - Abnormal; Notable for the following components:   Glucose-Capillary 369 (*)    All other components within normal limits  CBG MONITORING, ED - Abnormal; Notable for the following components:   Glucose-Capillary 382 (*)    All other components within normal limits  CBG MONITORING, ED - Abnormal; Notable for the following components:   Glucose-Capillary 327 (*)    All other components within normal limits  RESP PANEL BY RT-PCR (RSV, FLU A&B, COVID)  [id number]  I-STAT VENOUS BLOOD GAS, ED    EKG: None  Radiology: No results found.   .Critical Care  Performed by: Ruthell Lonni FALCON, PA-C Authorized by: Ruthell Lonni FALCON, PA-C   Critical care provider statement:    Critical care time (minutes):  55   Critical care time was exclusive of:  Separately billable procedures and treating other patients   Critical care was necessary to treat or prevent imminent or life-threatening deterioration of the following conditions:  Metabolic crisis   Critical care was time spent personally by me on the following activities:  Blood draw for specimens, development of treatment plan with patient or surrogate, discussions with consultants, discussions with primary provider, evaluation of patient's response to treatment, examination of patient, interpretation of cardiac output measurements, obtaining history from patient or surrogate, ordering and performing treatments and interventions, ordering and review of laboratory studies, ordering and review of radiographic studies, pulse oximetry, re-evaluation of patient's condition and review of old charts   I assumed direction of critical care for this patient from another provider in my specialty: no     Care discussed with: admitting provider      Medications Ordered in the ED  insulin  regular, human (MYXREDLIN ) 100 units/ 100 mL infusion (15 Units/hr  Intravenous New Bag/Given 02/10/24 0328)  lactated ringers  infusion (has no administration in time range)  dextrose  5 % in lactated ringers  infusion (has no administration in time range)  dextrose  50 % solution 0-50 mL (has no administration in time range)  potassium chloride  10 mEq in 100 mL IVPB (10 mEq Intravenous New Bag/Given 02/10/24 0424)  lactated ringers  bolus 2,160 mL (2,160 mLs Intravenous New Bag/Given 02/10/24 0322)    Clinical Course as of 02/10/24 0433  Thu Feb 10, 2024  0220 Corrected Na 136 [CG]  0240 7.05 [CG]    Clinical Course User Index [CG] Ruthell Lonni FALCON, PA-C    Medical Decision Making Amount and/or Complexity of Data Reviewed Labs: ordered.   38 year old male presenting to ED complaining of hyperglycemia, somnolence, feeling  generally unwell.  On exam, HD stable.  Lung sounds are clear bilaterally, no hypoxia.  Abdomen is soft and compressible.  Neurological examination is at baseline.  Assessed utilizing CBC, CMP, VBG, urinalysis, viral panel, hydroxybutyric acid.  CBC with leukocytosis 15.4, no anemia.  Metabolic panel with sodium 130, glucose 363, BUN 22, creatinine 1.66, GFR 54 and anion gap 25.  Sodium corrected for hyperglycemia is 136.  Patient is in DKA, started on Endo tool at this time.  Beta-hydroxybutyrate greater than 8.  Urinalysis with ketones, proteins, moderate hemoglobin.  VBG with pH 7.05.  Viral panel negative for all.  Patient admitted at this time to Dr. Charlton, [facility] hospitalist service for DKA.  Patient amenable to plan.  Stable on admission.    Final diagnoses:  Diabetic ketoacidosis without coma associated with type 2 diabetes mellitus [facility])    ED Discharge Orders     None          Ruthell Lonni FALCON, PA-C 02/10/24 0434    Bari Charmaine FALCON, MD 02/10/24 [id number]

## 2024-02-10 NOTE — Hospital Course (Signed)
 Joseph Bullock is a 38 yo male with PMH DM II, HTN, obesity who presented with hyperglycemia, fatigue, polyuria, polydipsia. Has had uptrending A1c recently and recently started on insulin  outpatient but had not yet started.  Due to feeling worse, he presented for further workup. Labs consistent with DKA and he was started on fluids and insulin  drip.

## 2024-02-10 NOTE — Assessment & Plan Note (Signed)
-   baseline creatinine ~ 1.1 - patient presents with increase in creat >0.3 mg/dL above baseline or creat increase >1.5x baseline presumed to have occurred within past 7 days PTA - creat 1.66 on admission - presumed prerenal; continue IVF - trend BMP

## 2024-02-10 NOTE — Assessment & Plan Note (Signed)
-   No obvious precipitating factor aside from probable lack of having started outpatient insulin  treatment - Labs consistent with DKA on admission - Continue fluids and insulin  drip -pH borderline and CO2 borderline but stable without need for bicarb drip at this time - q4h BMP until able to transition off insulin  drip

## 2024-02-10 NOTE — H&P (Signed)
 History and Physical    Joseph Bullock FMW:[id number] DOB: Apr 18, 1985 DOA: 02/10/2024  PCP: Wheeler Harlene CROME, NP   Patient coming from: Home   Chief Complaint: Hyperglycemia, fatigue, polyuria, polydipsia   HPI: Joseph Bullock is a 38 y.o. male with medical history significant for diabetes mellitus and OSA who presents with hyperglycemia, polyuria, polydipsia, and fatigue.  Patient reports that he was recently prescribed Lantus  but was unable to obtain supplies to administer it with until yesterday.  Over the past few days he has developed polyuria, polydipsia, and progressive fatigue.  He denies fevers, chills, abdominal pain, or vomiting.  EMS was called and administered 600 mL of IVF prior to arrival in the ED.  ED Course: Upon arrival to the ED, patient is found to be afebrile and saturating well on room air with elevated blood pressure.  Labs are most notable for glucose 363, serum bicarbonate 10, anion gap 25, WBC 15,400, ketonuria, and beta-hydroxybutyrate >8.   He was given 2.2 L LR, 20 mEq IV potassium, and was started on IV insulin  infusion.  Review of Systems:  All other systems reviewed and apart from HPI, are negative.  Past Medical History:  Diagnosis Date   Diabetes mellitus without complication (HCC)    Hypertension    Sleep apnea 2016   used CPAP for 2 years- have not used CPAP in 2 years    Past Surgical History:  Procedure Laterality Date   BICEPS TENDON REPAIR Right 10/27/2018   Procedure: PECTORALIS RECONSTRUCTION WITH ALLOGRAFT;  Surgeon: Dozier Soulier, MD;  Location: [facility];  Service: Orthopedics;  Laterality: Right;  REQUEST 120 MINUTES   NASAL SEPTUM SURGERY  2018   TONSILLECTOMY AND ADENOIDECTOMY  1994    Social History:   reports that he quit smoking about 14 years ago. His smoking use included cigarettes. He has never used smokeless tobacco. He reports current alcohol use of about 3.0 standard drinks of alcohol per week. He reports that he does  not use drugs.  Allergies[1]  Family History  Problem Relation Age of Onset   Prostate cancer Neg Hx    Colon cancer Neg Hx      Prior to Admission medications  Medication Sig Start Date End Date Taking? Authorizing Provider  insulin  glargine (LANTUS ) 100 UNIT/ML Solostar Pen Inject 15 Units into the skin at bedtime. 02/04/24  Yes Wheeler Harlene CROME, NP  metFORMIN  (GLUCOPHAGE ) 500 MG tablet Take 1 tablet (500 mg total) by mouth 2 (two) times daily with a meal. 11/17/23  Yes Freddi Hamilton, MD  Blood Glucose Monitoring Suppl DEVI 1 each by Does not apply route as directed. Dispense based on patient and insurance preference. Use up to four times daily as directed. (FOR ICD-10 E10.9, E11.9). 02/02/24   Wheeler Harlene CROME, NP  Glucose Blood (BLOOD GLUCOSE TEST STRIPS) STRP 1 each by Does not apply route as directed. Dispense based on patient and insurance preference. Use up to four times daily as directed. (FOR ICD-10 E10.9, E11.9). 02/02/24   Wheeler Harlene CROME, NP  Lancet Device MISC 1 each by Does not apply route as directed. Dispense based on patient and insurance preference. Use up to four times daily as directed. (FOR ICD-10 E10.9, E11.9). 02/02/24   Wheeler Harlene CROME, NP  Lancets MISC 1 each by Does not apply route as directed. Dispense based on patient and insurance preference. Use up to four times daily as directed. (FOR ICD-10 E10.9, E11.9). 02/02/24   Wheeler Harlene CROME, NP  lisinopril  (ZESTRIL ) 5 MG tablet Take 1 tablet (5 mg total) by mouth daily. Patient not taking: Reported on 02/10/2024 11/17/23   Freddi Hamilton, MD    Physical Exam: Vitals:   02/10/24 0315 02/10/24 0330 02/10/24 0345 02/10/24 0400  BP: (!) 154/92 (!) 133/91 (!) 149/97 (!) 160/97  Pulse: 97 89 89 92  Resp: 18 18 15 18   Temp:    98.1 F (36.7 C)  TempSrc:    Oral  SpO2: 97% 99% 98% 99%  Weight:      Height:         Constitutional: NAD, no pallor or diaphoresis   Eyes: PERTLA, lids and  conjunctivae normal ENMT: Mucous membranes are moist. Posterior pharynx clear of any exudate or lesions.   Neck: supple, no masses  Respiratory: no wheezing, no crackles. No accessory muscle use.  Cardiovascular: S1 & S2 heard, regular rate and rhythm. No extremity edema.  Abdomen: No tenderness, soft. Bowel sounds active.  Musculoskeletal: no clubbing / cyanosis. No joint deformity upper and lower extremities.   Skin: no significant rashes, lesions, ulcers. Warm, dry, well-perfused. Neurologic: CN 2-12 grossly intact. Moving all extremities. Alert and oriented.  Psychiatric: Calm. Cooperative.    Labs and Imaging on Admission: I have personally reviewed following labs and imaging studies  CBC: Recent Labs  Lab 02/10/24 0130  WBC 15.4*  NEUTROABS 12.2*  HGB 16.3  HCT 48.9  MCV 85.9  PLT 261   Basic Metabolic Panel: Recent Labs  Lab 02/10/24 0130  NA 130*  K 4.5  CL 96*  CO2 10*  GLUCOSE 363*  BUN 22*  CREATININE 1.66*  CALCIUM  9.2   GFR: Estimated Creatinine Clearance: 77.4 mL/min (A) (by C-G formula based on SCr of 1.66 mg/dL (H)). Liver Function Tests: Recent Labs  Lab 02/10/24 0130  AST 16  ALT 16  ALKPHOS 80  BILITOT 0.4  PROT 8.2*  ALBUMIN 4.4   No results for input(s): LIPASE, AMYLASE in the last 168 hours. No results for input(s): AMMONIA in the last 168 hours. Coagulation Profile: No results for input(s): INR, PROTIME in the last 168 hours. Cardiac Enzymes: No results for input(s): CKTOTAL, CKMB, CKMBINDEX, TROPONINI in the last 168 hours. BNP (last 3 results) No results for input(s): PROBNP in the last 8760 hours. HbA1C: No results for input(s): HGBA1C in the last 72 hours. CBG: Recent Labs  Lab 02/10/24 0103 02/10/24 0320 02/10/24 0422  GLUCAP 369* 382* 327*   Lipid Profile: No results for input(s): CHOL, HDL, LDLCALC, TRIG, CHOLHDL, LDLDIRECT in the last 72 hours. Thyroid Function Tests: No results  for input(s): TSH, T4TOTAL, FREET4, T3FREE, THYROIDAB in the last 72 hours. Anemia Panel: No results for input(s): VITAMINB12, FOLATE, FERRITIN, TIBC, IRON, RETICCTPCT in the last 72 hours. Urine analysis:    Component Value Date/Time   COLORURINE STRAW (A) 02/10/2024 0310   APPEARANCEUR CLEAR 02/10/2024 0310   LABSPEC 1.020 02/10/2024 0310   PHURINE 5.0 02/10/2024 0310   GLUCOSEU >=500 (A) 02/10/2024 0310   HGBUR MODERATE (A) 02/10/2024 0310   BILIRUBINUR NEGATIVE 02/10/2024 0310   BILIRUBINUR negative 11/17/2023 0832   BILIRUBINUR neg 12/11/2011 0918   KETONESUR 80 (A) 02/10/2024 0310   PROTEINUR 100 (A) 02/10/2024 0310   UROBILINOGEN 0.2 11/17/2023 0832   NITRITE NEGATIVE 02/10/2024 0310   LEUKOCYTESUR NEGATIVE 02/10/2024 0310   Sepsis Labs: @LABRCNTIP (procalcitonin:4,lacticidven:4) ) Recent Results (from the past 240 hours)  Resp panel by RT-PCR (RSV, Flu A&B, Covid) Anterior Nasal Swab  Status: None   Collection Time: 02/10/24  1:55 AM   Specimen: Anterior Nasal Swab  Result Value Ref Range Status   SARS Coronavirus 2 by RT PCR NEGATIVE NEGATIVE Final    Comment: (NOTE) SARS-CoV-2 target nucleic acids are NOT DETECTED.  The SARS-CoV-2 RNA is generally detectable in upper respiratory specimens during the acute phase of infection. The lowest concentration of SARS-CoV-2 viral copies this assay can detect is 138 copies/mL. A negative result does not preclude SARS-Cov-2 infection and should not be used as the sole basis for treatment or other patient management decisions. A negative result may occur with  improper specimen collection/handling, submission of specimen other than nasopharyngeal swab, presence of viral mutation(s) within the areas targeted by this assay, and inadequate number of viral copies(<138 copies/mL). A negative result must be combined with clinical observations, patient history, and epidemiological information. The expected  result is Negative.  Fact Sheet for Patients:  bloggercourse.com  Fact Sheet for Healthcare Providers:  seriousbroker.it  This test is no t yet approved or cleared by the [place]  FDA and  has been authorized for detection and/or diagnosis of SARS-CoV-2 by FDA under an Emergency Use Authorization (EUA). This EUA will remain  in effect (meaning this test can be used) for the duration of the COVID-19 declaration under Section 564(b)(1) of the Act, 21 U.S.C.section 360bbb-3(b)(1), unless the authorization is terminated  or revoked sooner.       Influenza A by PCR NEGATIVE NEGATIVE Final   Influenza B by PCR NEGATIVE NEGATIVE Final    Comment: (NOTE) The Xpert Xpress SARS-CoV-2/FLU/RSV plus assay is intended as an aid in the diagnosis of influenza from Nasopharyngeal swab specimens and should not be used as a sole basis for treatment. Nasal washings and aspirates are unacceptable for Xpert Xpress SARS-CoV-2/FLU/RSV testing.  Fact Sheet for Patients: bloggercourse.com  Fact Sheet for Healthcare Providers: seriousbroker.it  This test is not yet approved or cleared by the [place]  FDA and has been authorized for detection and/or diagnosis of SARS-CoV-2 by FDA under an Emergency Use Authorization (EUA). This EUA will remain in effect (meaning this test can be used) for the duration of the COVID-19 declaration under Section 564(b)(1) of the Act, 21 U.S.C. section 360bbb-3(b)(1), unless the authorization is terminated or revoked.     Resp Syncytial Virus by PCR NEGATIVE NEGATIVE Final    Comment: (NOTE) Fact Sheet for Patients: bloggercourse.com  Fact Sheet for Healthcare Providers: seriousbroker.it  This test is not yet approved or cleared by the [place]  FDA and has been authorized for detection and/or diagnosis of  SARS-CoV-2 by FDA under an Emergency Use Authorization (EUA). This EUA will remain in effect (meaning this test can be used) for the duration of the COVID-19 declaration under Section 564(b)(1) of the Act, 21 U.S.C. section 360bbb-3(b)(1), unless the authorization is terminated or revoked.  Performed at [facility], [address] [place], [place] [address]      Radiological Exams on Admission: No results found.   Assessment/Plan   1. DKA  - A1c was 15% earlier this month  - Continue IVF hydration and IV insulin  infusion with frequent CBGs and serial chem panels, consult diabetes educator    2. OSA  - CPAP while sleeping     DVT prophylaxis: Lovenox   Code Status: Full  Level of Care: Level of care: Stepdown Family Communication: None present  Disposition Plan:  Patient is from: Home  Anticipated d/c is to: Home  Anticipated d/c  date is: 02/12/24  Patient currently: Pending glycemic-control, resolution of DKA  Consults called: None  Admission status: Inpatient     Evalene GORMAN Sprinkles, MD [facility]  02/10/2024, 4:59 AM       [1] No Known Allergies

## 2024-02-10 NOTE — Assessment & Plan Note (Signed)
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

## 2024-02-10 NOTE — Telephone Encounter (Signed)
 Patient Product/process Development Scientist completed.    The patient is insured through [employer]. Patient has [employer], may use a copay card, and/or apply for patient assistance if available.    Ran test claim for Novolog  Flexpen and the current 30 day co-pay is $10.00.  Ran test claim for Dexcom G7 Sensor and the current 30 day co-pay is $50.00.  Ran test claim for Freestyle Libre 3 Plus Sensor and the current 30 day co-pay is $50.00.  This test claim was processed through [facility]- copay amounts may vary at other pharmacies due to pharmacy/plan contracts, or as the patient moves through the different stages of their insurance plan.     Reyes Sharps, CPHT Pharmacy Technician Patient Advocate Specialist Lead [facility] Patient Advocate Team Direct Number: [phone]  Fax: [phone]

## 2024-02-10 NOTE — Inpatient Diabetes Management (Signed)
 Inpatient Diabetes Program Recommendations  AACE/ADA: New Consensus Statement on Inpatient Glycemic Control (2015)  Target Ranges:  Prepandial:   less than 140 mg/dL      Peak postprandial:   less than 180 mg/dL (1-2 hours)      Critically ill patients:  140 - 180 mg/dL   Lab Results  Component Value Date   GLUCAP 213 (H) 02/10/2024   HGBA1C 15.0 (H) 02/02/2024    Review of Glycemic Control  Latest Reference Range & Units 02/10/24 09:22  CO2 22 - 32 mmol/L 12 (L)  Glucose 70 - 99 mg/dL 784 (H)  Anion gap 5 - 15  17 (H)  (L): Data is abnormally low (H): Data is abnormally high  Diabetes history: Newly diagnosed with DM 2 weeks ago Outpatient Diabetes medications: Lantus  15 units every day, Metformin  500 mg BID Current orders for Inpatient glycemic control: IV insulin   Met with patient at bedside.  He was diagnosed with DM 2 weeks ago.  Was started on Lantus  and Metformin .  Has been checking his glucose with his glucometer.  He just started his Lantus  last night because he did not have any pen needles.  Asked [facility] pharmacy for a benefit check on rapid insulin  and CGMs.  Will follow up tomorrow.    Thank you, Wyvonna Pinal, MSN, CDCES Diabetes Coordinator Inpatient Diabetes Program [phone] (team pager from 8a-5p)

## 2024-02-10 NOTE — Progress Notes (Signed)
 Progress Note    Joseph Bullock   FMW:[id number]  DOB: 1985-04-04  DOA: 02/10/2024     0 PCP: Joseph Harlene CROME, NP  Initial CC: Hyperglycemia  Hospital Course: Mr. Joseph Bullock is a 38 yo male with PMH DM II, HTN, obesity who presented with hyperglycemia, fatigue, polyuria, polydipsia. Has had uptrending A1c recently and recently started on insulin  outpatient but had not yet started.  Due to feeling worse, he presented for further workup. Labs consistent with DKA and he was started on fluids and insulin  drip.  Interval History:  Seen this morning in the ER.  Mostly comfortable and denies any nausea or vomiting.  Understands plan for fluids and insulin  drip.  Amenable with diet trial.  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, especially as a truck driver  - will transition to SQ after liberated from drips   AKI (acute kidney injury) - baseline creatinine ~ 1.1 - patient presents with increase in creat >0.3 mg/dL above baseline or creat increase >1.5x baseline presumed to have occurred within past 7 days PTA - creat 1.66 on admission - presumed prerenal; continue IVF - trend BMP   OSA on CPAP - Continue nightly CPAP   Antimicrobials:   DVT prophylaxis:  enoxaparin  (LOVENOX ) injection 40 mg Start: 02/10/24 1000   Code Status:   Code Status: Full Code  Mobility Assessment (Last 72 Hours)     Mobility Assessment   No documentation.      Diet: Diet Orders (From admission, onward)     Start     Ordered   02/10/24 0839  Diet Carb Modified Room service appropriate? Yes  Diet effective now       Question Answer Comment  Diet-HS Snack? Snack-yes   Calorie Level Medium 1600-2000   Fluid consistency: Thin   Room service appropriate? Yes      02/10/24 0838            Barriers to discharge: none Disposition Plan:  Home  HH orders placed: n/a Status is: Inpt  Objective: Blood pressure (!) 131/95, pulse (!) 108, temperature 98.6 F (37 C), resp. rate 15, height 6' (1.829 m), weight 108 kg, SpO2 100%.  Examination:  Physical Exam Constitutional:      General: He is not in acute distress.    Appearance: Normal appearance.  HENT:     Head: Normocephalic and atraumatic.     Mouth/Throat:     Mouth: Mucous membranes are moist.  Eyes:     Extraocular Movements: Extraocular movements intact.  Cardiovascular:     Rate and Rhythm: Normal rate and regular rhythm.  Pulmonary:     Effort: Pulmonary effort is normal. No respiratory distress.     Breath sounds: Normal breath sounds. No wheezing.  Abdominal:     General: Bowel sounds are normal. There is no distension.     Palpations: Abdomen is soft.     Tenderness: There is no abdominal tenderness.  Musculoskeletal:  General: Normal range of motion.     Cervical back: Normal range of motion and neck supple.  Skin:    General: Skin is warm and dry.  Neurological:     General: No focal deficit present.     Mental Status: He is alert.  Psychiatric:        Mood and Affect: Mood normal.        Behavior: Behavior normal.      Consultants:    Procedures:    Data Reviewed: Results for orders placed or performed during the hospital encounter of 02/10/24 (from the past 24 hours)  CBG monitoring, ED     Status: Abnormal   Collection Time: 02/10/24  1:03 AM  Result Value Ref Range   Glucose-Capillary 369 (H) 70 - 99 mg/dL   Comment 1 Notify RN     Comment 2 Document in Chart   CBC with Differential     Status: Abnormal   Collection Time: 02/10/24  1:30 AM  Result Value Ref Range   WBC 15.4 (H) 4.0 - 10.5 K/uL   RBC 5.69 4.22 - 5.81 MIL/uL   Hemoglobin 16.3 13.0 - 17.0 g/dL   HCT 51.0 60.9 - 47.9 %   MCV 85.9 80.0 - 100.0 fL   MCH 28.6 26.0 - 34.0 pg   MCHC 33.3 30.0 - 36.0 g/dL   RDW 86.7 88.4 - 84.4 %   Platelets 261 150 - 400 K/uL   nRBC 0.0 0.0 - 0.2 %   Neutrophils Relative % 80 %   Neutro Abs 12.2 (H) 1.7 - 7.7 K/uL   Lymphocytes Relative 15 %   Lymphs Abs 2.3 0.7 - 4.0 K/uL   Monocytes Relative 5 %   Monocytes Absolute 0.8 0.1 - 1.0 K/uL   Eosinophils Relative 0 %   Eosinophils Absolute 0.0 0.0 - 0.5 K/uL   Basophils Relative 0 %   Basophils Absolute 0.0 0.0 - 0.1 K/uL   Immature Granulocytes 0 %   Abs Immature Granulocytes 0.06 0.00 - 0.07 K/uL  Comprehensive metabolic panel     Status: Abnormal   Collection Time: 02/10/24  1:30 AM  Result Value Ref Range   Sodium 130 (L) 135 - 145 mmol/L   Potassium 4.5 3.5 - 5.1 mmol/L   Chloride 96 (L) 98 - 111 mmol/L   CO2 10 (L) 22 - 32 mmol/L   Glucose, Bld 363 (H) 70 - 99 mg/dL   BUN 22 (H) 6 - 20 mg/dL   Creatinine, Ser 8.33 (H) 0.61 - 1.24 mg/dL   Calcium  9.2 8.9 - 10.3 mg/dL   Total Protein 8.2 (H) 6.5 - 8.1 g/dL   Albumin 4.4 3.5 - 5.0 g/dL   AST 16 15 - 41 U/L   ALT 16 0 - 44 U/L   Alkaline Phosphatase 80 38 - 126 U/L   Total Bilirubin 0.4 0.0 - 1.2 mg/dL   GFR, Estimated 54 (L) >60 mL/min   Anion gap 25 (H) 5 - 15  Beta-hydroxybutyric acid     Status: Abnormal   Collection Time: 02/10/24  1:31 AM  Result Value Ref Range   Beta-Hydroxybutyric Acid >8.00 (H) 0.05 - 0.27 mmol/L  Resp panel by RT-PCR (RSV, Flu A&B, Covid) Anterior Nasal Swab     Status: None   Collection Time: 02/10/24  1:55 AM   Specimen: Anterior Nasal Swab  Result Value Ref Range   SARS Coronavirus 2 by RT PCR NEGATIVE NEGATIVE   Influenza A by PCR NEGATIVE  NEGATIVE   Influenza B by PCR  NEGATIVE NEGATIVE   Resp Syncytial Virus by PCR NEGATIVE NEGATIVE  Blood gas, venous (at [facility] and [facility])     Status: Abnormal   Collection Time: 02/10/24  2:01 AM  Result Value Ref Range   pH, Ven 7.05 (LL) 7.25 - 7.43   pCO2, Ven 36 (L) 44 - 60 mmHg   pO2, Ven 36 32 - 45 mmHg   Bicarbonate 10.0 (L) 20.0 - 28.0 mmol/L   Acid-base deficit 19.7 (H) 0.0 - 2.0 mmol/L   O2 Saturation 62.5 %   Patient temperature 37.0   Urinalysis, Routine w reflex microscopic -Urine, Clean Catch     Status: Abnormal   Collection Time: 02/10/24  3:10 AM  Result Value Ref Range   Color, Urine STRAW (A) YELLOW   APPearance CLEAR CLEAR   Specific Gravity, Urine 1.020 1.005 - 1.030   pH 5.0 5.0 - 8.0   Glucose, UA >=500 (A) NEGATIVE mg/dL   Hgb urine dipstick MODERATE (A) NEGATIVE   Bilirubin Urine NEGATIVE NEGATIVE   Ketones, ur 80 (A) NEGATIVE mg/dL   Protein, ur 899 (A) NEGATIVE mg/dL   Nitrite NEGATIVE NEGATIVE   Leukocytes,Ua NEGATIVE NEGATIVE   RBC / HPF 0-5 0 - 5 RBC/hpf   WBC, UA 0-5 0 - 5 WBC/hpf   Bacteria, UA NONE SEEN NONE SEEN   Squamous Epithelial / HPF 0-5 0 - 5 /HPF   Mucus PRESENT    Hyaline Casts, UA PRESENT   CBG monitoring, ED     Status: Abnormal   Collection Time: 02/10/24  3:20 AM  Result Value Ref Range   Glucose-Capillary 382 (H) 70 - 99 mg/dL  CBG monitoring, ED     Status: Abnormal   Collection Time: 02/10/24  4:22 AM  Result Value Ref Range   Glucose-Capillary 327 (H) 70 - 99 mg/dL  HIV Antibody (routine testing w rflx)     Status: None   Collection Time: 02/10/24  5:00 AM  Result Value Ref Range   HIV Screen 4th Generation wRfx Non Reactive Non Reactive  Basic metabolic panel     Status: Abnormal   Collection Time: 02/10/24  5:00 AM  Result Value Ref Range   Sodium 133 (L) 135 - 145 mmol/L   Potassium 4.0 3.5 - 5.1 mmol/L   Chloride 100 98 - 111 mmol/L   CO2 11 (L) 22 - 32 mmol/L   Glucose, Bld 249 (H) 70 - 99 mg/dL   BUN 19 6 - 20 mg/dL   Creatinine, Ser 8.48 (H)  0.61 - 1.24 mg/dL   Calcium  9.1 8.9 - 10.3 mg/dL   GFR, Estimated >39 >39 mL/min   Anion gap 22 (H) 5 - 15  CBG monitoring, ED     Status: Abnormal   Collection Time: 02/10/24  5:25 AM  Result Value Ref Range   Glucose-Capillary 270 (H) 70 - 99 mg/dL  CBG monitoring, ED     Status: Abnormal   Collection Time: 02/10/24  6:27 AM  Result Value Ref Range   Glucose-Capillary 224 (H) 70 - 99 mg/dL  CBG monitoring, ED     Status: Abnormal   Collection Time: 02/10/24  7:32 AM  Result Value Ref Range   Glucose-Capillary 215 (H) 70 - 99 mg/dL  CBG monitoring, ED     Status: Abnormal   Collection Time: 02/10/24  8:36 AM  Result Value Ref Range   Glucose-Capillary 207 (H) 70 - 99 mg/dL  Basic  metabolic panel     Status: Abnormal   Collection Time: 02/10/24  9:22 AM  Result Value Ref Range   Sodium 130 (L) 135 - 145 mmol/L   Potassium 3.5 3.5 - 5.1 mmol/L   Chloride 101 98 - 111 mmol/L   CO2 12 (L) 22 - 32 mmol/L   Glucose, Bld 215 (H) 70 - 99 mg/dL   BUN 18 6 - 20 mg/dL   Creatinine, Ser 8.65 (H) 0.61 - 1.24 mg/dL   Calcium  9.1 8.9 - 10.3 mg/dL   GFR, Estimated >39 >39 mL/min   Anion gap 17 (H) 5 - 15  CBG monitoring, ED     Status: Abnormal   Collection Time: 02/10/24  9:42 AM  Result Value Ref Range   Glucose-Capillary 200 (H) 70 - 99 mg/dL  CBG monitoring, ED     Status: Abnormal   Collection Time: 02/10/24 10:51 AM  Result Value Ref Range   Glucose-Capillary 223 (H) 70 - 99 mg/dL  CBG monitoring, ED     Status: Abnormal   Collection Time: 02/10/24 11:51 AM  Result Value Ref Range   Glucose-Capillary 221 (H) 70 - 99 mg/dL  CBG monitoring, ED     Status: Abnormal   Collection Time: 02/10/24  1:31 PM  Result Value Ref Range   Glucose-Capillary 213 (H) 70 - 99 mg/dL    I have reviewed pertinent nursing notes, vitals, labs, and images as necessary. I have ordered labwork to follow up on as indicated.  I have reviewed the last notes from staff over past 24 hours. I have  discussed patient's care plan and test results with nursing staff, CM/SW, and other staff as appropriate.  Old records reviewed in assessment of this patient  Time spent: Greater than 50% of the 55 minute visit was spent in counseling/coordination of care for the patient as laid out in the A&P.   LOS: 0 days   Alm Apo, MD [facility] 02/10/2024, 2:11 PM

## 2024-02-10 NOTE — Assessment & Plan Note (Signed)
 -  Continue nightly CPAP

## 2024-02-10 NOTE — ED Triage Notes (Addendum)
°  Patient BIB EMS for hyperglycemia that has been going on for a couple days.  Patient states he was recently diagnosed with type 1 diabetes and was initially given supplies to check his cbg and administer insulin .  Patient states he recently ran out of supplies to give himself insulin  so it has been running high.  Denies any N/V.  CBG was 350 with EMS.  Denies any pain.   Was given about 600 ml of NS by EMS.

## 2024-02-10 NOTE — Plan of Care (Signed)
°  Problem: Education: Goal: Ability to describe self-care measures that may prevent or decrease complications (Diabetes Survival Skills Education) will improve Outcome: Progressing Goal: Individualized Educational Video(s) Outcome: Progressing    Problem: Metabolic: Goal: Ability to maintain appropriate glucose levels will improve Outcome: Progressing   Problem: Education: Goal: Ability to describe self-care measures that may prevent or decrease complications (Diabetes Survival Skills Education) will improve Outcome: Progressing Goal: Individualized Educational Video(s) Outcome: Progressing   Problem: Respiratory: Goal: Will regain and/or maintain adequate ventilation Outcome: Progressing   Problem: Clinical Measurements: Goal: Ability to maintain clinical measurements within normal limits will improve Outcome: Progressing Goal: Will remain free from infection Outcome: Progressing Goal: Diagnostic test results will improve Outcome: Progressing Goal: Respiratory complications will improve Outcome: Progressing Goal: Cardiovascular complication will be avoided Outcome: Progressing

## 2024-02-11 ENCOUNTER — Other Ambulatory Visit (HOSPITAL_COMMUNITY): Payer: Self-pay

## 2024-02-11 DIAGNOSIS — E119 Type 2 diabetes mellitus without complications: Secondary | ICD-10-CM | POA: Diagnosis not present

## 2024-02-11 DIAGNOSIS — N179 Acute kidney failure, unspecified: Secondary | ICD-10-CM | POA: Diagnosis not present

## 2024-02-11 DIAGNOSIS — E1110 Type 2 diabetes mellitus with ketoacidosis without coma: Secondary | ICD-10-CM | POA: Diagnosis not present

## 2024-02-11 DIAGNOSIS — Z794 Long term (current) use of insulin: Secondary | ICD-10-CM | POA: Diagnosis not present

## 2024-02-11 LAB — GLUCOSE, CAPILLARY
Glucose-Capillary: 156 mg/dL — ABNORMAL HIGH (ref 70–99)
Glucose-Capillary: 157 mg/dL — ABNORMAL HIGH (ref 70–99)
Glucose-Capillary: 163 mg/dL — ABNORMAL HIGH (ref 70–99)
Glucose-Capillary: 164 mg/dL — ABNORMAL HIGH (ref 70–99)
Glucose-Capillary: 164 mg/dL — ABNORMAL HIGH (ref 70–99)
Glucose-Capillary: 172 mg/dL — ABNORMAL HIGH (ref 70–99)
Glucose-Capillary: 173 mg/dL — ABNORMAL HIGH (ref 70–99)
Glucose-Capillary: 174 mg/dL — ABNORMAL HIGH (ref 70–99)
Glucose-Capillary: 177 mg/dL — ABNORMAL HIGH (ref 70–99)
Glucose-Capillary: 178 mg/dL — ABNORMAL HIGH (ref 70–99)
Glucose-Capillary: 213 mg/dL — ABNORMAL HIGH (ref 70–99)
Glucose-Capillary: 224 mg/dL — ABNORMAL HIGH (ref 70–99)
Glucose-Capillary: 241 mg/dL — ABNORMAL HIGH (ref 70–99)
Glucose-Capillary: 243 mg/dL — ABNORMAL HIGH (ref 70–99)

## 2024-02-11 LAB — MAGNESIUM: Magnesium: 2.1 mg/dL (ref 1.7–2.4)

## 2024-02-11 LAB — BASIC METABOLIC PANEL WITH GFR
Anion gap: 13 (ref 5–15)
Anion gap: 11 (ref 5–15)
Anion gap: 10 (ref 5–15)
BUN: 17 mg/dL (ref 6–20)
BUN: 14 mg/dL (ref 6–20)
BUN: 15 mg/dL (ref 6–20)
CO2: 17 mmol/L — ABNORMAL LOW (ref 22–32)
CO2: 19 mmol/L — ABNORMAL LOW (ref 22–32)
CO2: 18 mmol/L — ABNORMAL LOW (ref 22–32)
Calcium: 9.6 mg/dL (ref 8.9–10.3)
Calcium: 9.8 mg/dL (ref 8.9–10.3)
Calcium: 9.4 mg/dL (ref 8.9–10.3)
Chloride: 104 mmol/L (ref 98–111)
Chloride: 105 mmol/L (ref 98–111)
Chloride: 106 mmol/L (ref 98–111)
Creatinine, Ser: 1.33 mg/dL — ABNORMAL HIGH (ref 0.61–1.24)
Creatinine, Ser: 1.13 mg/dL (ref 0.61–1.24)
Creatinine, Ser: 1.07 mg/dL (ref 0.61–1.24)
GFR, Estimated: >60 mL/min
GFR, Estimated: >60 mL/min
GFR, Estimated: >60 mL/min
Glucose, Bld: 161 mg/dL — ABNORMAL HIGH (ref 70–99)
Glucose, Bld: 156 mg/dL — ABNORMAL HIGH (ref 70–99)
Glucose, Bld: 230 mg/dL — ABNORMAL HIGH (ref 70–99)
Potassium: 3.8 mmol/L (ref 3.5–5.1)
Potassium: 3.2 mmol/L — ABNORMAL LOW (ref 3.5–5.1)
Potassium: 3.1 mmol/L — ABNORMAL LOW (ref 3.5–5.1)
Sodium: 134 mmol/L — ABNORMAL LOW (ref 135–145)
Sodium: 134 mmol/L — ABNORMAL LOW (ref 135–145)
Sodium: 134 mmol/L — ABNORMAL LOW (ref 135–145)

## 2024-02-11 LAB — CBC WITH DIFFERENTIAL/PLATELET
Abs Immature Granulocytes: 0.04 K/uL (ref 0.00–0.07)
Basophils Absolute: 0 K/uL (ref 0.0–0.1)
Basophils Relative: 0 %
Eosinophils Absolute: 0 K/uL (ref 0.0–0.5)
Eosinophils Relative: 0 %
HCT: 43.5 % (ref 39.0–52.0)
Hemoglobin: 15.2 g/dL (ref 13.0–17.0)
Immature Granulocytes: 0 %
Lymphocytes Relative: 19 %
Lymphs Abs: 2.1 K/uL (ref 0.7–4.0)
MCH: 28.4 pg (ref 26.0–34.0)
MCHC: 34.9 g/dL (ref 30.0–36.0)
MCV: 81.3 fL (ref 80.0–100.0)
Monocytes Absolute: 0.8 K/uL (ref 0.1–1.0)
Monocytes Relative: 7 %
Neutro Abs: 8 K/uL — ABNORMAL HIGH (ref 1.7–7.7)
Neutrophils Relative %: 74 %
Platelets: 216 K/uL (ref 150–400)
RBC: 5.35 MIL/uL (ref 4.22–5.81)
RDW: 13.2 % (ref 11.5–15.5)
WBC: 11 K/uL — ABNORMAL HIGH (ref 4.0–10.5)
nRBC: 0 % (ref 0.0–0.2)

## 2024-02-11 LAB — BETA-HYDROXYBUTYRIC ACID
Beta-Hydroxybutyric Acid: 1.28 mmol/L — ABNORMAL HIGH (ref 0.05–0.27)
Beta-Hydroxybutyric Acid: 0.33 mmol/L — ABNORMAL HIGH (ref 0.05–0.27)

## 2024-02-11 MED ORDER — INSULIN ASPART 100 UNIT/ML IJ SOLN
10.0000 [IU] | Freq: Three times a day (TID) | INTRAMUSCULAR | Status: DC
  Administered 2024-02-11: 10 [IU] via SUBCUTANEOUS
  Filled 2024-02-11: qty 10

## 2024-02-11 MED ORDER — DEXTROSE IN LACTATED RINGERS 5 % IV SOLN: INTRAVENOUS | Status: DC

## 2024-02-11 MED ORDER — PEN NEEDLES 31G X 5 MM MISC
1.0000 | Freq: Three times a day (TID) | 5 refills | Status: DC
  Filled 2024-02-11: qty 100, 25d supply, fill #0

## 2024-02-11 MED ORDER — INSULIN GLARGINE 100 UNITS/ML SOLOSTAR PEN: 38.0000 [IU] | PEN_INJECTOR | Freq: Every day | SUBCUTANEOUS | Status: DC

## 2024-02-11 MED ORDER — INSULIN ASPART 100 UNIT/ML IJ SOLN
0.0000 [IU] | Freq: Three times a day (TID) | INTRAMUSCULAR | Status: DC
  Administered 2024-02-11: 3 [IU] via SUBCUTANEOUS
  Filled 2024-02-11: qty 3

## 2024-02-11 MED ORDER — POTASSIUM CHLORIDE CRYS ER 20 MEQ PO TBCR
40.0000 meq | EXTENDED_RELEASE_TABLET | ORAL | Status: AC
  Administered 2024-02-11 (×2): 40 meq via ORAL
  Filled 2024-02-11 (×2): qty 2

## 2024-02-11 MED ORDER — INSULIN GLARGINE 100 UNIT/ML SOLOSTAR PEN: 35.0000 [IU] | PEN_INJECTOR | Freq: Every day | SUBCUTANEOUS | Status: DC

## 2024-02-11 MED ORDER — INSULIN ASPART 100 UNIT/ML FLEXPEN
0.0000 [IU] | PEN_INJECTOR | Freq: Three times a day (TID) | SUBCUTANEOUS | 0 refills | Status: DC
  Filled 2024-02-11: qty 15, 30d supply, fill #0

## 2024-02-11 MED ORDER — INSULIN GLARGINE 100 UNIT/ML ~~LOC~~ SOLN
38.0000 [IU] | Freq: Every day | SUBCUTANEOUS | Status: DC
  Administered 2024-02-11: 38 [IU] via SUBCUTANEOUS
  Filled 2024-02-11: qty 0.38

## 2024-02-11 NOTE — Progress Notes (Signed)
 Dr Patsy instructed RN to cut D5LR to KVO while insulin  gtt remains running. Orders placed by MD to transition off the gtt. RN will carefully monitor CBGs until pt off insulin  gtt.

## 2024-02-11 NOTE — Discharge Summary (Signed)
 " Physician Discharge Summary   Joseph Bullock FMW:[id number] DOB: [birth year] DOA: 02/10/2024  PCP: Wheeler Harlene CROME, NP  Admit date: 02/10/2024 Discharge date: 02/11/2024  Admitted From: Home Disposition:  Home Discharging physician: Alm Apo, MD Barriers to discharge: none  Recommendations at discharge: Follow up with primary care for further insulin  adjustments; review glucose log Refill/prescribe CGM if patient amenable  Continue pursuing GLP-1 agonist therapies; decrease insulin  doses with anticipated weight loss   Discharge Condition: stable CODE STATUS: Full  Diet recommendation:  Diet Orders (From admission, onward)     Start     Ordered   02/11/24 0000  Diet Carb Modified        02/11/24 1359   02/10/24 0839  Diet Carb Modified Room service appropriate? Yes  Diet effective now       Question Answer Comment  Diet-HS Snack? Snack-yes   Calorie Level Medium 1600-2000   Fluid consistency: Thin   Room service appropriate? Yes      02/10/24 0838            Hospital Course: [name] is a 38 yo male with PMH DM II, HTN, obesity who presented with hyperglycemia, fatigue, polyuria, polydipsia. Has had uptrending A1c recently and recently started on insulin  outpatient but had not yet started.  Due to feeling worse, he presented for further workup. Labs consistent with DKA and he was started on fluids and insulin  drip.  Assessment and Plan: * DKA (diabetic ketoacidosis) (HCC)-resolved as of 02/11/2024 - No obvious precipitating factor aside from probable lack of having started outpatient insulin  treatment - Labs consistent with DKA on admission -pH borderline and CO2 borderline but stable without need for bicarb drip at this time - q4h BMP until able to transition off insulin  drip; gap closed well and CO2 improved -Tolerated transition to diet and insulin  drip able to be weaned off  DMII (diabetes mellitus, type 2) (HCC) - A1c 15% on 02/02/24 - recently  started on Lantus  outpt but he states had not yet started - DM coordinator consulted on admission as well; 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, especially as a truck driver  - will transition to SQ after liberated from drips  - Lantus  dose adjusted prior to discharge also started on NovoLog  sliding scale -Outpatient follow-up with PCP for further adjustments  AKI (acute kidney injury)-resolved as of 02/11/2024 - baseline creatinine ~ 1.1 - patient presents with increase in creat >0.3 mg/dL above baseline or creat increase >1.5x baseline presumed to have occurred within past 7 days PTA - creat 1.66 on admission - presumed prerenal; continue IVF - Normalized after fluids   OSA on CPAP - Continue nightly CPAP       The patient's acute and chronic medical conditions were treated accordingly. On day of discharge, patient was felt deemed stable for discharge. Patient/family member advised to call PCP or come back to ER if needed.   Principal Diagnosis: DKA (diabetic ketoacidosis) (HCC)  Discharge Diagnoses: Active Hospital Problems   Diagnosis Date Noted   DMII (diabetes mellitus, type 2) (HCC) 02/02/2024    Priority: 2.   OSA on CPAP 02/19/2014    Resolved Hospital Problems   Diagnosis Date Noted Date Resolved   DKA (diabetic ketoacidosis) (HCC) 02/10/2024 02/11/2024    Priority: 1.   AKI (acute kidney injury) 02/10/2024 02/11/2024    Priority: 3.     Discharge Instructions     Diet Carb Modified   Complete by: As directed    Increase activity  slowly   Complete by: As directed       Allergies as of 02/11/2024   No Known Allergies      Medication List     TAKE these medications    Blood Glucose Monitoring Suppl Devi 1 each by Does not apply route as directed. Dispense based on patient and insurance preference. Use up to four times daily as directed. (FOR ICD-10 E10.9, E11.9).   BLOOD GLUCOSE TEST STRIPS Strp 1 each by Does not apply route as directed. Dispense based on  patient and insurance preference. Use up to four times daily as directed. (FOR ICD-10 E10.9, E11.9).   insulin  aspart 100 UNIT/ML FlexPen Commonly known as: NOVOLOG  Inject 0-11 Units into the skin 4 (four) times daily -  before meals and at bedtime. If eating and Blood Glucose (BG) 80 or higher inject 10 units for meal coverage and add correction dose per scale. If not eating, correction dose only. BG <150= 0 unit; BG 150-200= 1 unit; BG 201-250= 3 unit; BG 251-300= 5 unit; BG 301-350= 7 unit; BG 351-400= 9 unit; BG >400= 11 unit and Call Primary Care.   insulin  glargine 100 UNIT/ML Solostar Pen Commonly known as: LANTUS  Inject 35 Units into the skin daily. What changed:  how much to take when to take this   Lancet Device Misc 1 each by Does not apply route as directed. Dispense based on patient and insurance preference. Use up to four times daily as directed. (FOR ICD-10 E10.9, E11.9).   Lancets Misc 1 each by Does not apply route as directed. Dispense based on patient and insurance preference. Use up to four times daily as directed. (FOR ICD-10 E10.9, E11.9).   lisinopril  5 MG tablet Commonly known as: ZESTRIL  Take 1 tablet (5 mg total) by mouth daily.   metFORMIN  500 MG tablet Commonly known as: GLUCOPHAGE  Take 1 tablet (500 mg total) by mouth 2 (two) times daily with a meal.   Pen Needles 31G X 5 MM Misc 1 each by Does not apply route 4 (four) times daily -  before meals and at bedtime. Dispense based on patient and insurance preference. Use up to four times daily as directed. (FOR ICD-10 E10.9, E11.9).        Follow-up Information     Wheeler Harlene CROME, NP. Schedule an appointment as soon as possible for a visit in 1 week(s).   Specialty: Nurse Practitioner Contact information: [address], [address] [place] [address] [phone]                Allergies[1]  Consultations:   Procedures:   Discharge Exam: BP (!) 119/96   Pulse 100    Temp 98.2 F (36.8 C) (Oral)   Resp 12   Ht 6' (1.829 m)   Wt 108 kg   SpO2 99%   BMI 32.28 kg/m  Physical Exam Constitutional:      General: He is not in acute distress.    Appearance: Normal appearance.  HENT:     Head: Normocephalic and atraumatic.     Mouth/Throat:     Mouth: Mucous membranes are moist.  Eyes:     Extraocular Movements: Extraocular movements intact.  Cardiovascular:     Rate and Rhythm: Normal rate and regular rhythm.  Pulmonary:     Effort: Pulmonary effort is normal. No respiratory distress.     Breath sounds: Normal breath sounds. No wheezing.  Abdominal:     General: Bowel sounds are normal.  There is no distension.     Palpations: Abdomen is soft.     Tenderness: There is no abdominal tenderness.  Musculoskeletal:        General: Normal range of motion.     Cervical back: Normal range of motion and neck supple.  Skin:    General: Skin is warm and dry.  Neurological:     General: No focal deficit present.     Mental Status: He is alert.  Psychiatric:        Mood and Affect: Mood normal.        Behavior: Behavior normal.      The results of significant diagnostics from this hospitalization (including imaging, microbiology, ancillary and laboratory) are listed below for reference.   Microbiology: Recent Results (from the past 240 hours)  Resp panel by RT-PCR (RSV, Flu A&B, Covid) Anterior Nasal Swab     Status: None   Collection Time: 02/10/24  1:55 AM   Specimen: Anterior Nasal Swab  Result Value Ref Range Status   SARS Coronavirus 2 by RT PCR NEGATIVE NEGATIVE Final    Comment: (NOTE) SARS-CoV-2 target nucleic acids are NOT DETECTED.  The SARS-CoV-2 RNA is generally detectable in upper respiratory specimens during the acute phase of infection. The lowest concentration of SARS-CoV-2 viral copies this assay can detect is 138 copies/mL. A negative result does not preclude SARS-Cov-2 infection and should not be used as the sole basis for  treatment or other patient management decisions. A negative result may occur with  improper specimen collection/handling, submission of specimen other than nasopharyngeal swab, presence of viral mutation(s) within the areas targeted by this assay, and inadequate number of viral copies(<138 copies/mL). A negative result must be combined with clinical observations, patient history, and epidemiological information. The expected result is Negative.  Fact Sheet for Patients:  bloggercourse.com  Fact Sheet for Healthcare Providers:  seriousbroker.it  This test is no t yet approved or cleared by the [place]  FDA and  has been authorized for detection and/or diagnosis of SARS-CoV-2 by FDA under an Emergency Use Authorization (EUA). This EUA will remain  in effect (meaning this test can be used) for the duration of the COVID-19 declaration under Section 564(b)(1) of the Act, 21 U.S.C.section 360bbb-3(b)(1), unless the authorization is terminated  or revoked sooner.       Influenza A by PCR NEGATIVE NEGATIVE Final   Influenza B by PCR NEGATIVE NEGATIVE Final    Comment: (NOTE) The Xpert Xpress SARS-CoV-2/FLU/RSV plus assay is intended as an aid in the diagnosis of influenza from Nasopharyngeal swab specimens and should not be used as a sole basis for treatment. Nasal washings and aspirates are unacceptable for Xpert Xpress SARS-CoV-2/FLU/RSV testing.  Fact Sheet for Patients: bloggercourse.com  Fact Sheet for Healthcare Providers: seriousbroker.it  This test is not yet approved or cleared by the [place]  FDA and has been authorized for detection and/or diagnosis of SARS-CoV-2 by FDA under an Emergency Use Authorization (EUA). This EUA will remain in effect (meaning this test can be used) for the duration of the COVID-19 declaration under Section 564(b)(1) of the Act, 21  U.S.C. section 360bbb-3(b)(1), unless the authorization is terminated or revoked.     Resp Syncytial Virus by PCR NEGATIVE NEGATIVE Final    Comment: (NOTE) Fact Sheet for Patients: bloggercourse.com  Fact Sheet for Healthcare Providers: seriousbroker.it  This test is not yet approved or cleared by the [place]  FDA and has been authorized for detection and/or diagnosis of SARS-CoV-2  by FDA under an Emergency Use Authorization (EUA). This EUA will remain in effect (meaning this test can be used) for the duration of the COVID-19 declaration under Section 564(b)(1) of the Act, 21 U.S.C. section 360bbb-3(b)(1), unless the authorization is terminated or revoked.  Performed at [facility], [address] [place], [place] [address]      Labs: BNP (last 3 results) No results for input(s): BNP in the last 8760 hours. Basic Metabolic Panel: Recent Labs  Lab 02/10/24 1723 02/10/24 2031 02/11/24 0052 02/11/24 0739 02/11/24 1021  NA 131* 132* 134* 134* 134*  K 3.7 4.0 3.8 3.2* 3.1*  CL 102 105 104 105 106  CO2 15* 16* 17* 19* 18*  GLUCOSE 295* 211* 161* 156* 230*  BUN 20 19 17 14 15   CREATININE 1.55* 1.35* 1.33* 1.13 1.07  CALCIUM  9.2 9.5 9.6 9.8 9.4  MG  --   --   --  2.1  --    Liver Function Tests: Recent Labs  Lab 02/10/24 0130  AST 16  ALT 16  ALKPHOS 80  BILITOT 0.4  PROT 8.2*  ALBUMIN 4.4   No results for input(s): LIPASE, AMYLASE in the last 168 hours. No results for input(s): AMMONIA in the last 168 hours. CBC: Recent Labs  Lab 02/10/24 0130 02/11/24 0439  WBC 15.4* 11.0*  NEUTROABS 12.2* 8.0*  HGB 16.3 15.2  HCT 48.9 43.5  MCV 85.9 81.3  PLT 261 216   Cardiac Enzymes: No results for input(s): CKTOTAL, CKMB, CKMBINDEX, TROPONINI in the last 168 hours. BNP: Invalid input(s): POCBNP CBG: Recent Labs  Lab 02/11/24 0934 02/11/24 1013 02/11/24 1107  02/11/24 1212 02/11/24 1353  GLUCAP 243* 224* 213* 163* 157*   D-Dimer No results for input(s): DDIMER in the last 72 hours. Hgb A1c No results for input(s): HGBA1C in the last 72 hours. Lipid Profile No results for input(s): CHOL, HDL, LDLCALC, TRIG, CHOLHDL, LDLDIRECT in the last 72 hours. Thyroid function studies No results for input(s): TSH, T4TOTAL, T3FREE, THYROIDAB in the last 72 hours.  Invalid input(s): FREET3 Anemia work up No results for input(s): VITAMINB12, FOLATE, FERRITIN, TIBC, IRON, RETICCTPCT in the last 72 hours. Urinalysis    Component Value Date/Time   COLORURINE STRAW (A) 02/10/2024 0310   APPEARANCEUR CLEAR 02/10/2024 0310   LABSPEC 1.020 02/10/2024 0310   PHURINE 5.0 02/10/2024 0310   GLUCOSEU >=500 (A) 02/10/2024 0310   HGBUR MODERATE (A) 02/10/2024 0310   BILIRUBINUR NEGATIVE 02/10/2024 0310   BILIRUBINUR negative 11/17/2023 0832   BILIRUBINUR neg 12/11/2011 0918   KETONESUR 80 (A) 02/10/2024 0310   PROTEINUR 100 (A) 02/10/2024 0310   UROBILINOGEN 0.2 11/17/2023 0832   NITRITE NEGATIVE 02/10/2024 0310   LEUKOCYTESUR NEGATIVE 02/10/2024 0310   Sepsis Labs Recent Labs  Lab 02/10/24 0130 02/11/24 0439  WBC 15.4* 11.0*   Microbiology Recent Results (from the past 240 hours)  Resp panel by RT-PCR (RSV, Flu A&B, Covid) Anterior Nasal Swab     Status: None   Collection Time: 02/10/24  1:55 AM   Specimen: Anterior Nasal Swab  Result Value Ref Range Status   SARS Coronavirus 2 by RT PCR NEGATIVE NEGATIVE Final    Comment: (NOTE) SARS-CoV-2 target nucleic acids are NOT DETECTED.  The SARS-CoV-2 RNA is generally detectable in upper respiratory specimens during the acute phase of infection. The lowest concentration of SARS-CoV-2 viral copies this assay can detect is 138 copies/mL. A negative result does not preclude SARS-Cov-2 infection and should not  be used as the sole basis for treatment or other patient  management decisions. A negative result may occur with  improper specimen collection/handling, submission of specimen other than nasopharyngeal swab, presence of viral mutation(s) within the areas targeted by this assay, and inadequate number of viral copies(<138 copies/mL). A negative result must be combined with clinical observations, patient history, and epidemiological information. The expected result is Negative.  Fact Sheet for Patients:  bloggercourse.com  Fact Sheet for Healthcare Providers:  seriousbroker.it  This test is no t yet approved or cleared by the [place]  FDA and  has been authorized for detection and/or diagnosis of SARS-CoV-2 by FDA under an Emergency Use Authorization (EUA). This EUA will remain  in effect (meaning this test can be used) for the duration of the COVID-19 declaration under Section 564(b)(1) of the Act, 21 U.S.C.section 360bbb-3(b)(1), unless the authorization is terminated  or revoked sooner.       Influenza A by PCR NEGATIVE NEGATIVE Final   Influenza B by PCR NEGATIVE NEGATIVE Final    Comment: (NOTE) The Xpert Xpress SARS-CoV-2/FLU/RSV plus assay is intended as an aid in the diagnosis of influenza from Nasopharyngeal swab specimens and should not be used as a sole basis for treatment. Nasal washings and aspirates are unacceptable for Xpert Xpress SARS-CoV-2/FLU/RSV testing.  Fact Sheet for Patients: bloggercourse.com  Fact Sheet for Healthcare Providers: seriousbroker.it  This test is not yet approved or cleared by the [place]  FDA and has been authorized for detection and/or diagnosis of SARS-CoV-2 by FDA under an Emergency Use Authorization (EUA). This EUA will remain in effect (meaning this test can be used) for the duration of the COVID-19 declaration under Section 564(b)(1) of the Act, 21 U.S.C. section 360bbb-3(b)(1),  unless the authorization is terminated or revoked.     Resp Syncytial Virus by PCR NEGATIVE NEGATIVE Final    Comment: (NOTE) Fact Sheet for Patients: bloggercourse.com  Fact Sheet for Healthcare Providers: seriousbroker.it  This test is not yet approved or cleared by the [place]  FDA and has been authorized for detection and/or diagnosis of SARS-CoV-2 by FDA under an Emergency Use Authorization (EUA). This EUA will remain in effect (meaning this test can be used) for the duration of the COVID-19 declaration under Section 564(b)(1) of the Act, 21 U.S.C. section 360bbb-3(b)(1), unless the authorization is terminated or revoked.  Performed at [facility], [address], [place], [place] [address]     Procedures/Studies: No results found.   Time coordinating discharge: Over 30 minutes    Alm Apo, MD  [facility] 02/11/2024, 2:02 PM    [1] No Known Allergies  "

## 2024-02-11 NOTE — Progress Notes (Signed)
" °   02/11/24 1428  TOC Brief Assessment  Insurance and Status Reviewed  Patient has primary care physician Yes  Home environment has been reviewed Apartment  Prior level of function: Independent with ADL's  Prior/Current Home Services No current home services  Social Drivers of Health Review SDOH reviewed no interventions necessary  Readmission risk has been reviewed Yes  Transition of care needs no transition of care needs at this time    "

## 2024-02-11 NOTE — Inpatient Diabetes Management (Addendum)
 Inpatient Diabetes Program Recommendations  AACE/ADA: New Consensus Statement on Inpatient Glycemic Control (2015)  Target Ranges:  Prepandial:   less than 140 mg/dL      Peak postprandial:   less than 180 mg/dL (1-2 hours)      Critically ill patients:  140 - 180 mg/dL   Lab Results  Component Value Date   GLUCAP 157 (H) 02/11/2024   HGBA1C 15.0 (H) 02/02/2024    Review of Glycemic Control  Diabetes history: DM2 Outpatient Diabetes medications: Lantus  20 units every day, Metformin  500 mg BID Current orders for Inpatient glycemic control:  Transitioned off IV insulin .   Semglee  38 units every day Novolog  0-15 units TID and 0-5 units at bedtime Novolog  10 units TID  Met with patient at bedside.  Discussed increased insulin  needs and need for rapid insulin  with meals.  Discussed how to administer a correction dose and add meal coverage to that TID.  Applied Freestyle Libre 3 to the back of his left arm.  Reviewed hypoglycemia, < 70 mg/dL, signs, symptoms and treatments.  He needs to follow up with PCP.  He should call PCP for glucose trends consistently >200 mg/dL or <899 mg/dL.  Educated on The Plate Method, CHO's, portion control, avoiding caloric beverages, CBGs at home fasting and mid afternoon, F/U with PCP every 3 months, bring meter to PCP office, long and short term complications of uncontrolled BG, and importance of exercise.  Thank you, Wyvonna Pinal, MSN, CDCES Diabetes Coordinator Inpatient Diabetes Program [phone] (team pager from 8a-5p)

## 2024-02-11 NOTE — Progress Notes (Signed)
 Long acting insulin  given, per orders, at 0943. Insulin  gtt stopped at 1216 and SSI administered per protocol. RN will continue to monitor CBGs closely.

## 2024-02-11 NOTE — Progress Notes (Signed)
 Discharge meds in a secure bag delivered to patient by this RN

## 2024-02-11 NOTE — Progress Notes (Signed)
 Complex Care Management Note Care Guide Note  02/11/2024 Name: Joseph Bullock MRN: [id number] DOB: 02-19-1986   Complex Care Management Outreach Attempts: A second unsuccessful outreach was attempted today to offer the patient with information about available complex care management services.  Follow Up Plan:  Additional outreach attempts will be made to offer the patient complex care management information and services.   Encounter Outcome:  No Answer  Dreama [facility]  [facility], [facility] VBCI Assistant Direct Dial: [phone]  Fax: [phone]\

## 2024-02-11 NOTE — Progress Notes (Addendum)
 Diabetes education completed by coordinator. AVS instructions reviewed with patient per orders. All questions answered at this time. Care plan completed. Patient's home meds returned to patient from pharmacy. PIVs removed. Patient's DC meds delivered to patient at bedside. Patient was transported downstairs to main entrance in the wheelchair by RN where his ride was waiting to transport him home.

## 2024-02-13 NOTE — Progress Notes (Addendum)
 "  Acute Office Visit  Subjective:     Patient ID: Joseph Bullock, male    DOB: May 09, 1985, 38 y.o.   MRN: [id number]  No chief complaint on file.   HPI Patient is in today for hospital follow up for DKA.   Recent hospitalization Admission date: 02/10/2024 Discharge date: 02/11/2024 Diagnosis: DKA  Diabetes: - Checking glucose at home: CGM - Medications: Metformin  500 mg BID, Lantus  15 units, Insulin  aspart (Novolog ) 10 units coverage and sliding scale with meals - Compliant with medications - Denies symptoms of hypoglycemia, polyuria, polydipsia, numbness extremities, foot ulcers/trauma, visual changes, wounds that are not healing, medication side effects   After hospital discharge, he reports fluctuating blood sugars with fasting values from 120 to 180 mg/dL and postprandial readings up to 250 mg/dL, decreasing to about 799 mg/dL. He is taking metformin , 15 units of Lantus ,  insulin  aspart with meal coverage and sliding scale. Reports taking medications as prescribed.  Patient denies fever, chills, SOB, CP, palpitations, dyspnea, edema, HA, vision changes, N/V/D, abdominal pain, urinary symptoms, rash, weight changes, and recent illness or hospitalizations.   ROS  See HPI    Objective:    BP 123/79   Pulse (!) 107   Ht 6' (1.829 m)   Wt 231 lb (104.8 kg)   SpO2 98%   BMI 31.33 kg/m    Physical Exam Vitals reviewed.  Constitutional:      General: He is not in acute distress.    Appearance: He is not toxic-appearing.  HENT:     Head: Normocephalic and atraumatic.     Mouth/Throat:     Mouth: Mucous membranes are moist.     Pharynx: Oropharynx is clear.  Eyes:     Pupils: Pupils are equal, round, and reactive to light.  Cardiovascular:     Rate and Rhythm: Normal rate and regular rhythm.     Pulses: Normal pulses.     Heart sounds: Normal heart sounds. No murmur heard. Pulmonary:     Effort: Pulmonary effort is normal. No respiratory distress.     Breath  sounds: Normal breath sounds. No wheezing.  Musculoskeletal:        General: No swelling.     Cervical back: Neck supple.  Skin:    General: Skin is warm and dry.  Neurological:     General: No focal deficit present.     Mental Status: He is alert and oriented to person, place, and time.  Psychiatric:        Mood and Affect: Mood normal.        Behavior: Behavior normal.        Thought Content: Thought content normal.        Judgment: Judgment normal.      No results found for any visits on 02/14/24.      Assessment & Plan:   Problem List Items Addressed This Visit       Endocrine   DMII (diabetes mellitus, type 2) (HCC)   - Pt doing well with CGM- [place] Libre -Increase metformin  to 1000 mg BID - Rx- Start Mounjaro  2.5 mg inj weekly. Medication and common side effects reviewed with the patient; patient voiced understanding and had no further questions at this time. Ensure adequate protein intake while taking this medication. -Once Mounjaro  is initiated, reduce mealtime insulin  (aspart) to sliding-scale coverage only. - Continue Lantus , dosing education provided. - Referred to diabetes education, encourage FU -Encourage FU with endocrinology - Educated on hypoglycemia management  Relevant Medications   metFORMIN  (GLUCOPHAGE ) 1000 MG tablet   tirzepatide  (MOUNJARO ) 2.5 MG/0.5ML Pen     Other   Proteinuria   Advised adequate daily hydration with water , at least 64 oz daily.  Recheck UACR and eGFR in 2 months Consider ACE-I if UACR remains elevated       Other Visit Diagnoses       DM (diabetes mellitus) type 2, uncontrolled, with ketoacidosis (HCC)    -  Primary   Relevant Medications   metFORMIN  (GLUCOPHAGE ) 1000 MG tablet   tirzepatide  (MOUNJARO ) 2.5 MG/0.5ML Pen     Hospital discharge follow-up           Meds ordered this encounter  Medications   metFORMIN  (GLUCOPHAGE ) 1000 MG tablet    Sig: Take 1 tablet (1,000 mg total) by mouth 2 (two)  times daily with a meal.    Dispense:  180 tablet    Refill:  3    Supervising Provider:   DOMENICA BLACKBIRD A [[id number]]   tirzepatide  (MOUNJARO ) 2.5 MG/0.5ML Pen    Sig: Inject 2.5 mg into the skin once a week.    Dispense:  2 mL    Refill:  1    Supervising Provider:   DOMENICA BLACKBIRD A [[id number]]   DISCONTD: insulin  aspart (NOVOLOG ) 100 UNIT/ML FlexPen    Sig: Inject 0-11 Units into the skin 4 (four) times daily -  before meals and at bedtime. Inject 0-11 Units into the skin 4 (four) times daily - before meals and at bedtime. Correction dose per scale. BG <150= 0 unit; BG 150-200= 1 unit; BG 201-250= 3 unit; BG 251-300= 5 unit; BG 301-350= 7 unit; BG 351-400= 9 unit; BG >400= 11 unit and Call Primary Care.    Dispense:  15 mL    Refill:  0    Supervising Provider:   DOMENICA BLACKBIRD A [[id number]]        Return in about 6 weeks (around 03/27/2024), or FU mounjaro .  Joseph LITTIE Jolly, NP   "

## 2024-02-14 ENCOUNTER — Ambulatory Visit: Admitting: Student

## 2024-02-14 ENCOUNTER — Encounter: Payer: Self-pay | Admitting: Student

## 2024-02-14 ENCOUNTER — Other Ambulatory Visit: Payer: Self-pay | Admitting: Student

## 2024-02-14 VITALS — BP 123/79 | HR 107 | Ht 72.0 in | Wt 231.0 lb

## 2024-02-14 DIAGNOSIS — R809 Proteinuria, unspecified: Secondary | ICD-10-CM | POA: Diagnosis not present

## 2024-02-14 DIAGNOSIS — E119 Type 2 diabetes mellitus without complications: Secondary | ICD-10-CM

## 2024-02-14 DIAGNOSIS — E1110 Type 2 diabetes mellitus with ketoacidosis without coma: Secondary | ICD-10-CM | POA: Diagnosis not present

## 2024-02-14 DIAGNOSIS — Z09 Encounter for follow-up examination after completed treatment for conditions other than malignant neoplasm: Secondary | ICD-10-CM | POA: Diagnosis not present

## 2024-02-14 DIAGNOSIS — Z794 Long term (current) use of insulin: Secondary | ICD-10-CM | POA: Diagnosis not present

## 2024-02-14 MED ORDER — INSULIN ASPART 100 UNIT/ML FLEXPEN: 0.0000 [IU] | PEN_INJECTOR | Freq: Three times a day (TID) | SUBCUTANEOUS | 0 refills | Status: DC

## 2024-02-14 MED ORDER — METFORMIN HCL 1000 MG PO TABS: 1000.0000 mg | ORAL_TABLET | Freq: Two times a day (BID) | ORAL | 3 refills | Status: DC

## 2024-02-14 MED ORDER — TIRZEPATIDE 2.5 MG/0.5ML ~~LOC~~ SOAJ: 2.5000 mg | SUBCUTANEOUS | 1 refills | Status: DC

## 2024-02-14 NOTE — Telephone Encounter (Signed)
 Pharmacy comment: Script Clarification:NOT COVERED TRY HUMALOG.

## 2024-02-14 NOTE — Assessment & Plan Note (Addendum)
-   Pt doing well with CGM- Taylor Libre -Increase metformin  to 1000 mg BID - Rx- Start Mounjaro  2.5 mg inj weekly. Medication and common side effects reviewed with the patient; patient voiced understanding and had no further questions at this time. Ensure adequate protein intake while taking this medication. -Once Mounjaro  is initiated, reduce mealtime insulin  (aspart) to sliding-scale coverage only. - Continue Lantus , dosing education provided. - Referred to diabetes education, encourage FU -Encourage FU with endocrinology - Educated on hypoglycemia management

## 2024-02-14 NOTE — Addendum Note (Signed)
 Addended by: WHEELER HARLENE CROME on: 02/14/2024 02:43 PM   Modules accepted: Orders

## 2024-02-14 NOTE — Assessment & Plan Note (Addendum)
 Advised adequate daily hydration with water , at least 64 oz daily.  Recheck UACR and eGFR in 2 months Consider ACE-I if UACR remains elevated

## 2024-02-18 ENCOUNTER — Telehealth: Payer: Self-pay | Admitting: Student

## 2024-02-18 ENCOUNTER — Ambulatory Visit: Admitting: Student

## 2024-02-18 NOTE — Progress Notes (Signed)
 Complex Care Management Note  Care Guide Note 02/18/2024 Name: Joseph Bullock MRN: [id number] DOB: November 26, 1985  Joseph Bullock is a 38 y.o. year old male who sees Wheeler Harlene CROME, NP for primary care. I reached out to Joseph Bullock by phone today to offer complex care management services.  [name] was given information about Complex Care Management services today including:   The Complex Care Management services include support from the care team which includes your Nurse Care Manager, Clinical Social Worker, or Pharmacist.  The Complex Care Management team is here to help remove barriers to the health concerns and goals most important to you. Complex Care Management services are voluntary, and the patient may decline or stop services at any time by request to their care team member.   Complex Care Management Consent Status: Patient agreed to services and verbal consent obtained.   Follow up plan:  Telephone appointment with complex care management team member scheduled for:  03/01/24 at 11:00 a.m.   Encounter Outcome:  Patient Scheduled  [name]  [facility], [facility] VBCI Assistant Direct Dial: [phone]  Fax: [phone]

## 2024-02-18 NOTE — Telephone Encounter (Signed)
 Copied from CRM #[id number]. Topic: General - Other >> Feb 18, 2024  9:58 AM Montie POUR wrote: Reason for CRM:  Mr. Brandt wants to know if NP Yacopino received short term disability paperwork from [employer]. Please call Mr. Zeimet at [phone] to let him know if it was received or send a message through MyChart. Thanks

## 2024-02-18 NOTE — Telephone Encounter (Signed)
 Pt dropped off short term disability paperwork for pcp to fill out. Papers placed in pcps box

## 2024-02-22 ENCOUNTER — Encounter: Payer: Self-pay | Admitting: *Deleted

## 2024-02-22 NOTE — Telephone Encounter (Signed)
 Sent mychart message to pt letting him know that forms were received.

## 2024-02-23 DIAGNOSIS — Z0279 Encounter for issue of other medical certificate: Secondary | ICD-10-CM

## 2024-02-28 ENCOUNTER — Encounter: Payer: Self-pay | Admitting: *Deleted

## 2024-03-01 ENCOUNTER — Other Ambulatory Visit: Admitting: Pharmacist

## 2024-03-01 ENCOUNTER — Encounter: Payer: Self-pay | Admitting: Pharmacist

## 2024-03-01 MED ORDER — FREESTYLE LIBRE 3 PLUS SENSOR MISC: 2 refills | Status: DC

## 2024-03-01 NOTE — Progress Notes (Signed)
 "  03/01/2024 Name: Joseph Bullock MRN: [id number] DOB: 1986/01/10  Chief Complaint  Patient presents with   Diabetes    Joseph Bullock is a 39 y.o. year old male who presented for a telephone visit.   They were referred to the pharmacist by their PCP for assistance in managing diabetes and medication access.    Subjective:  Care Team: Primary Care Provider: Wheeler Harlene CROME, NP ; Next Scheduled Visit: 03/2024  Medication Access/Adherence  Current Pharmacy:  [id number] [place], [place] - [address] [place] [address] Phone: [phone] Fax: [phone]   Patient reports affordability concerns with their medications: No  Patient reports access/transportation concerns to their pharmacy: No  Patient reports adherence concerns with their medications:  Yes  - patient stopped insulin  because his blood glucose was getting too low.    Diabetes: Newly diagnosed type 2 DM  HPI: Patient presented to urgent care 11/17/2023 with polyuria. Symptoms been ongoing for about a week and a half. He was found to be hyperglycemic and hypertensive and sent to the ER for further evaluation. In ER he was diagnoses as new onset diabetes. He did meet criteria for DKA though it was mild. He was given fluids and subcutaneous insulin  Patient did not want to stay in the hospital. His BMP was checked but there was a delay due to a lab analyzer problem His anion gap was normal on recheck but his bicarb does remain mildly low. At ER discharge he was started on metformin  ER 500mg  twice a day and lisinopril  5mg  daily.   He was seen by PCP 02/02/2024 and A1c was noted to be 15%. Started Lantus  (insulin  glargine) injection, to take once daily at bedtime, starting at 15 units. Increase the Lantus  dose by 2 units every 2 days until your morning fasting blood sugar is below 150 mg/dL. If you have a low morning fasting blood sugar reading (<80), decrease the Lantus  dose by 2  units.   Patient presented to hospital on 02/10/2024 because he was feeling worse. He had not started Lantus . He was noted to be in DKA. Patient was started on [place] sensors. Also started on insulin  aspart - use as directed to correct elevated blood glucose. And dose of Lantus  was increased to 35 units daily.  He had follow up with PCP 02/14/2024 - dose of metformin  was increased to 1000mg  twice a day and Moujaro 2.5mg  weekly started.  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 He has to have a documented A1c < 10% to return to work.   Current medications:  Only taking metformin  1000mg  - once daily (prescribed twice a day) Mounjaro  2.5mg  - patient never started because blood glucose improved and he has lost weight with diet changes and exercise. Insulin  glargine and insulin  aspart - patient stopped because he was experiencing low blood glucose < 80  Current glucose readings: Using LIbre 3 plus sensor (samples from hospital and app on his phone)   Date of Download: 03/01/2024 % Time CGM is active: 95% Average Glucose: 124 mg/dL Glucose Management Indicator: 6.3%  Glucose Variability: 21.6 (goal <36%) Time in Goal:  - Time in range 70-180: 96% - Time above range: 4% - Time below range: 0% Observed patterns:  Patient denies hypoglycemic s/sx including occasional dizziness, shakiness, sweating. Patient denies hyperglycemic symptoms including no polyuria, polydipsia, polyphagia, nocturia, neuropathy, blurred vision.  Current meal patterns:  Patient reports he has increased his intake of vegetables and whole grains. He is having cottage cheese, eggs at breakfast.  Limiting sugar intake.  Only fruit he is eating are berries.  Occasionally have something like pizza that increases blood glucose.   Current physical activity: working out at [employer] almost daily.  Current medication access support: none  Macrovascular  and Microvascular Risk Reduction:  Statin? no; no history of statin therapy; ACEi/ARB? He was taking lisinopril  but stopped 02/14/2024 due to patient preference Last urinary albumin/creatinine ratio:  Lab Results  Component Value Date   MICRALBCREAT 245.8 (H) 02/02/2024   Last eye exam:  not checked yet Last foot exam: No foot exam found Tobacco Use:  Tobacco Use: Medium Risk (02/14/2024)   Patient History    Smoking Tobacco Use: Former    Smokeless Tobacco Use: Never    Passive Exposure: Not on file     Objective:  BP Readings from Last 3 Encounters:  02/14/24 123/79  02/11/24 (!) 139/91  02/02/24 126/78     Lab Results  Component Value Date   HGBA1C 15.0 (H) 02/02/2024    Lab Results  Component Value Date   CREATININE 1.07 02/11/2024   BUN 15 02/11/2024   NA 134 (L) 02/11/2024   K 3.1 (L) 02/11/2024   CL 106 02/11/2024   CO2 18 (L) 02/11/2024    Lab Results  Component Value Date   CHOL 164 02/02/2024   HDL 33.40 (L) 02/02/2024   LDLCALC 63 02/02/2024   TRIG 335.0 (H) 02/02/2024   CHOLHDL 5 02/02/2024    Medications Reviewed Today     Reviewed by Carla Milling, RPH-CPP (Pharmacist) on 03/01/24 at 1132  Med List Status: <None>   Medication Order Taking? Sig Documenting Provider Last Dose Status Informant  Continuous Glucose Receiver (FREESTYLE LIBRE 3 READER) DEVI [id number]  by Does not apply route.  Patient not taking: Reported on 03/01/2024   [provider]  Active   insulin  glargine (LANTUS ) 100 UNIT/ML Solostar Pen [id number]  Inject 35 Units into the skin daily.  Patient not taking: Reported on 03/01/2024   Patsy Lenis, MD  Active   insulin  lispro (HUMALOG) 100 UNIT/ML injection [id number]  Inject 0-11 Units into the skin three times daily before meals. Correction dose per scale. BG <150= 0 unit; BG 150-200= 1 unit; BG 201-250= 3 unit; BG 251-300= 5 unit; BG 301-350= 7 unit; BG 351-400= 9 unit; BG >400= 11 unit and Call Primary Care.   Patient not taking: Reported on 03/01/2024   Wheeler Harlene CROME, NP  Active   Insulin  Pen Needle (PEN NEEDLES) 31G X 5 MM MISC [id number]  1 each by Does not apply route 4 (four) times daily -  before meals and at bedtime. Dispense based on patient and insurance preference. Use up to four times daily as directed. (FOR ICD-10 E10.9, E11.9). Patsy Lenis, MD  Active     Discontinued 03/01/24 1130 Lancets MISC [id number]  1 each by Does not apply route as directed. Dispense based on patient and insurance preference. Use up to four times daily as directed. (FOR ICD-10 E10.9, E11.9). Yacopino, Jessica L, NP  Active Self  metFORMIN  (GLUCOPHAGE ) 1000 MG tablet [id number] Yes Take 1 tablet (1,000 mg total) by mouth 2 (two) times daily with a meal.  Patient taking differently: Take 1,000 mg by mouth daily with breakfast.   Wheeler Harlene CROME, NP  Active   tirzepatide  (MOUNJARO ) 2.5 MG/0.5ML Pen [id number]  Inject 2.5 mg into the skin once a week.  Patient not taking: Reported on 03/01/2024   Wheeler Harlene CROME, NP  Active               Assessment/Plan:   Diabetes: - Currently uncontrolled; goal A1c <7%. Cardiorenal risk reduction is opportunities for improvement.. Blood pressure is at goal <130/80. LDL is at goal.  - Reviewed long term cardiovascular and renal outcomes of uncontrolled blood sugar., Reviewed goal A1c, goal fasting, and goal 2 hour post prandial glucose. Recommended to check glucose continuously with Continuous Glucose Monitor , Reviewed dietary modifications including ADA plate method; continue to eat more non-starchy vegetable, whole gains and lean proteins.., and Reviewed lifestyle modifications including goal of at least 150 minutes per week of exercise. - Recommend to continue metformin  1000mg  daily. Will update PCP with his recent blood glucose readings and med changes. - Discussed benefits of GLP1 / Mounjaro  on heart, blood glucose and weight. Patient would like to continue with  diet and exercise for weight loss and blood glucose control for now.  - We did discuss the honeymoon phase of diabetes and that it is important that he continue to check blood glucose daily and to continue with lifestyle changes. Need to monitor to make sure blood glucose does not increase again.  - Will check with PCP about checking A1c sooner than February as patient is wanting to see if A1c is now < 10% so that he can return to work.  - Offered printout of Continuous Glucose Monitor report showing GMI of 6.3 over the last 14 days but patient declined. He states his work will request an A1c report.   Meds ordered this encounter  Medications   Continuous Glucose Sensor (FREESTYLE LIBRE 3 PLUS SENSOR) MISC    Sig: Change sensor every 15 days.    Dispense:  2 each    Refill:  2     Follow Up Plan: 2 to 4 weeks.  Madelin Ray, PharmD Clinical Pharmacist [facility]    "

## 2024-03-07 ENCOUNTER — Encounter: Payer: Self-pay | Admitting: Student

## 2024-03-15 ENCOUNTER — Other Ambulatory Visit

## 2024-03-20 ENCOUNTER — Other Ambulatory Visit

## 2024-03-28 DIAGNOSIS — E781 Pure hyperglyceridemia: Secondary | ICD-10-CM | POA: Insufficient documentation

## 2024-03-28 NOTE — Assessment & Plan Note (Signed)
 Upcoming appointment with Pulmonology

## 2024-03-29 ENCOUNTER — Encounter: Payer: Self-pay | Admitting: Student

## 2024-03-29 ENCOUNTER — Ambulatory Visit: Admitting: Student

## 2024-03-29 VITALS — BP 138/88 | HR 72 | Temp 98.0°F | Resp 16 | Ht 72.0 in | Wt 247.0 lb

## 2024-03-29 DIAGNOSIS — G4733 Obstructive sleep apnea (adult) (pediatric): Secondary | ICD-10-CM

## 2024-03-29 DIAGNOSIS — E781 Pure hyperglyceridemia: Secondary | ICD-10-CM

## 2024-03-29 DIAGNOSIS — E119 Type 2 diabetes mellitus without complications: Secondary | ICD-10-CM

## 2024-03-29 DIAGNOSIS — R809 Proteinuria, unspecified: Secondary | ICD-10-CM

## 2024-03-29 LAB — LIPID PANEL
Cholesterol: 137 mg/dL (ref 28–200)
HDL: 38.9 mg/dL — ABNORMAL LOW
LDL Cholesterol: 67 mg/dL (ref 10–99)
NonHDL: 97.65
Total CHOL/HDL Ratio: 4
Triglycerides: 152 mg/dL — ABNORMAL HIGH (ref 10.0–149.0)
VLDL: 30.4 mg/dL (ref 0.0–40.0)

## 2024-03-29 LAB — HEMOGLOBIN A1C: Hgb A1c MFr Bld: 9.4 % — ABNORMAL HIGH (ref 4.6–6.5)

## 2024-03-29 MED ORDER — FREESTYLE LIBRE 3 PLUS SENSOR MISC: 3 refills | Status: AC

## 2024-03-29 NOTE — Assessment & Plan Note (Addendum)
 Repeat lipid panel today.  Encourage heart healthy diet such as MIND or DASH diet, increase exercise, avoid trans fats, simple carbohydrates and processed foods, consider a krill or fish or flaxseed oil cap daily.   - Consider low-dose statin if triglycerides remain elevated.

## 2024-03-29 NOTE — Assessment & Plan Note (Addendum)
 Lab Results  Component Value Date   MICROALBUR 17.2 (H) 02/02/2024  Continue to monitor

## 2024-03-30 ENCOUNTER — Telehealth: Payer: Self-pay | Admitting: Student

## 2024-03-30 ENCOUNTER — Ambulatory Visit: Payer: Self-pay | Admitting: Student

## 2024-03-30 DIAGNOSIS — E119 Type 2 diabetes mellitus without complications: Secondary | ICD-10-CM

## 2024-03-30 NOTE — Telephone Encounter (Signed)
 Pt dropped off papers to be completed by pcp. Pt wants to be called when forms are ready to be completed, placed in providers tray.

## 2024-05-03 ENCOUNTER — Encounter: Admitting: Student

## 2024-07-28 ENCOUNTER — Ambulatory Visit (HOSPITAL_BASED_OUTPATIENT_CLINIC_OR_DEPARTMENT_OTHER): Admitting: Pulmonary Disease
